# Patient Record
Sex: Female | Born: 1960 | Race: White | Hispanic: No | Smoking: Never smoker
Health system: Southern US, Community
[De-identification: ages and names within clinical notes are randomized; demographics above are authoritative.]

## PROBLEM LIST (undated history)

## (undated) DIAGNOSIS — T7840XA Allergy, unspecified, initial encounter: Secondary | ICD-10-CM

## (undated) HISTORY — DX: Allergy, unspecified, initial encounter: T78.40XA

---

## 2000-02-17 ENCOUNTER — Other Ambulatory Visit: Admission: RE | Admit: 2000-02-17 | Discharge: 2000-02-17 | Payer: Self-pay | Admitting: Obstetrics and Gynecology

## 2001-09-26 ENCOUNTER — Other Ambulatory Visit: Admission: RE | Admit: 2001-09-26 | Discharge: 2001-09-26 | Payer: Self-pay | Admitting: Obstetrics and Gynecology

## 2002-11-22 ENCOUNTER — Other Ambulatory Visit: Admission: RE | Admit: 2002-11-22 | Discharge: 2002-11-22 | Payer: Self-pay | Admitting: Obstetrics and Gynecology

## 2003-05-30 ENCOUNTER — Other Ambulatory Visit: Admission: RE | Admit: 2003-05-30 | Discharge: 2003-05-30 | Payer: Self-pay | Admitting: Obstetrics and Gynecology

## 2003-12-25 ENCOUNTER — Other Ambulatory Visit: Admission: RE | Admit: 2003-12-25 | Discharge: 2003-12-25 | Payer: Self-pay | Admitting: Obstetrics and Gynecology

## 2004-07-08 ENCOUNTER — Other Ambulatory Visit: Admission: RE | Admit: 2004-07-08 | Discharge: 2004-07-08 | Payer: Self-pay | Admitting: Obstetrics and Gynecology

## 2005-02-03 ENCOUNTER — Other Ambulatory Visit: Admission: RE | Admit: 2005-02-03 | Discharge: 2005-02-03 | Payer: Self-pay | Admitting: Obstetrics and Gynecology

## 2007-03-01 ENCOUNTER — Ambulatory Visit: Payer: Self-pay | Admitting: Family Medicine

## 2007-03-01 DIAGNOSIS — I479 Paroxysmal tachycardia, unspecified: Secondary | ICD-10-CM | POA: Insufficient documentation

## 2007-03-06 ENCOUNTER — Encounter: Payer: Self-pay | Admitting: Family Medicine

## 2007-03-07 ENCOUNTER — Telehealth: Payer: Self-pay | Admitting: Family Medicine

## 2007-03-07 ENCOUNTER — Encounter: Payer: Self-pay | Admitting: Family Medicine

## 2007-03-07 LAB — CONVERTED CEMR LAB
ALT: 13 units/L (ref 0–35)
AST: 14 units/L (ref 0–37)
Albumin: 4.3 g/dL (ref 3.5–5.2)
Alkaline Phosphatase: 69 units/L (ref 39–117)
BUN: 18 mg/dL (ref 6–23)
Chloride: 104 meq/L (ref 96–112)
Creatinine, Ser: 1.03 mg/dL (ref 0.40–1.20)
HDL: 69 mg/dL (ref >39)
Hemoglobin: 12.1 g/dL (ref 12.0–15.0)
LDL Cholesterol: 88 mg/dL (ref 0–99)
MCHC: 31 g/dL (ref 30.0–36.0)
Platelets: 256 10*3/uL (ref 150–400)
Potassium: 4.6 meq/L (ref 3.5–5.3)
RDW: 16.1 % — ABNORMAL HIGH (ref 11.5–14.0)
TSH: 3.26 microintl units/mL (ref 0.350–5.50)
Total CHOL/HDL Ratio: 2.8

## 2007-03-08 ENCOUNTER — Telehealth (INDEPENDENT_AMBULATORY_CARE_PROVIDER_SITE_OTHER): Payer: Self-pay | Admitting: *Deleted

## 2007-03-08 ENCOUNTER — Encounter: Payer: Self-pay | Admitting: Family Medicine

## 2007-03-08 LAB — CONVERTED CEMR LAB: Ferritin: 46 ng/mL (ref 10–291)

## 2007-03-12 ENCOUNTER — Encounter: Payer: Self-pay | Admitting: Family Medicine

## 2007-03-12 ENCOUNTER — Telehealth: Payer: Self-pay | Admitting: Family Medicine

## 2007-03-14 ENCOUNTER — Ambulatory Visit: Payer: Self-pay | Admitting: Cardiology

## 2007-04-04 ENCOUNTER — Encounter: Payer: Self-pay | Admitting: Family Medicine

## 2007-04-05 ENCOUNTER — Ambulatory Visit: Payer: Self-pay

## 2007-04-05 ENCOUNTER — Ambulatory Visit: Payer: Self-pay | Admitting: Cardiology

## 2007-04-05 ENCOUNTER — Encounter: Payer: Self-pay | Admitting: Cardiology

## 2007-05-09 ENCOUNTER — Ambulatory Visit: Payer: Self-pay | Admitting: Cardiology

## 2007-06-14 ENCOUNTER — Ambulatory Visit: Payer: Self-pay | Admitting: Family Medicine

## 2007-06-18 ENCOUNTER — Telehealth: Payer: Self-pay | Admitting: Family Medicine

## 2007-07-13 ENCOUNTER — Ambulatory Visit: Payer: Self-pay | Admitting: Family Medicine

## 2007-08-08 ENCOUNTER — Telehealth: Payer: Self-pay | Admitting: Family Medicine

## 2007-08-16 ENCOUNTER — Ambulatory Visit: Payer: Self-pay | Admitting: Family Medicine

## 2007-09-18 ENCOUNTER — Encounter: Admission: RE | Admit: 2007-09-18 | Discharge: 2007-09-18 | Payer: Self-pay | Admitting: Family Medicine

## 2007-09-18 ENCOUNTER — Ambulatory Visit: Payer: Self-pay | Admitting: Family Medicine

## 2007-09-18 LAB — CONVERTED CEMR LAB
Nitrite: NEGATIVE
Specific Gravity, Urine: 1.02
Urobilinogen, UA: 0.2

## 2007-10-29 LAB — HM DEXA SCAN: HM Dexa Scan: NORMAL

## 2007-11-08 ENCOUNTER — Telehealth: Payer: Self-pay | Admitting: Family Medicine

## 2008-02-05 ENCOUNTER — Telehealth: Payer: Self-pay | Admitting: Family Medicine

## 2008-02-15 ENCOUNTER — Ambulatory Visit: Payer: Self-pay | Admitting: Family Medicine

## 2008-02-15 DIAGNOSIS — G4700 Insomnia, unspecified: Secondary | ICD-10-CM | POA: Insufficient documentation

## 2008-03-19 ENCOUNTER — Telehealth: Payer: Self-pay | Admitting: Family Medicine

## 2008-04-30 ENCOUNTER — Telehealth: Payer: Self-pay | Admitting: Family Medicine

## 2008-07-01 ENCOUNTER — Telehealth: Payer: Self-pay | Admitting: Family Medicine

## 2008-09-18 ENCOUNTER — Telehealth: Payer: Self-pay | Admitting: Family Medicine

## 2008-10-02 ENCOUNTER — Ambulatory Visit: Payer: Self-pay | Admitting: Family Medicine

## 2008-10-09 ENCOUNTER — Telehealth: Payer: Self-pay | Admitting: Family Medicine

## 2008-10-23 ENCOUNTER — Ambulatory Visit: Payer: Self-pay | Admitting: Family Medicine

## 2008-10-23 ENCOUNTER — Telehealth: Payer: Self-pay | Admitting: Family Medicine

## 2008-10-23 ENCOUNTER — Encounter: Admission: RE | Admit: 2008-10-23 | Discharge: 2008-10-23 | Payer: Self-pay | Admitting: Family Medicine

## 2008-10-23 LAB — CONVERTED CEMR LAB
Nitrite: NEGATIVE
Protein, U semiquant: NEGATIVE
Urobilinogen, UA: 0.2
WBC Urine, dipstick: NEGATIVE

## 2008-10-24 LAB — CONVERTED CEMR LAB
BUN: 19 mg/dL (ref 6–23)
CO2: 22 meq/L (ref 19–32)
Cholesterol: 146 mg/dL (ref 0–200)
Creatinine, Ser: 1.03 mg/dL (ref 0.40–1.20)
Glucose, Bld: 88 mg/dL (ref 70–99)
HDL: 70 mg/dL (ref >39)
Total Bilirubin: 0.6 mg/dL (ref 0.3–1.2)
Total CHOL/HDL Ratio: 2.1
Total Protein: 7.4 g/dL (ref 6.0–8.3)
Triglycerides: 91 mg/dL (ref <150)
VLDL: 18 mg/dL (ref 0–40)

## 2008-10-27 ENCOUNTER — Telehealth: Payer: Self-pay | Admitting: Family Medicine

## 2008-11-03 ENCOUNTER — Telehealth: Payer: Self-pay | Admitting: Family Medicine

## 2009-01-07 ENCOUNTER — Telehealth: Payer: Self-pay | Admitting: Family Medicine

## 2009-02-06 ENCOUNTER — Ambulatory Visit: Payer: Self-pay | Admitting: Family Medicine

## 2009-02-06 DIAGNOSIS — T7491XA Unspecified adult maltreatment, confirmed, initial encounter: Secondary | ICD-10-CM | POA: Insufficient documentation

## 2009-06-03 ENCOUNTER — Telehealth: Payer: Self-pay | Admitting: Family Medicine

## 2009-06-19 ENCOUNTER — Encounter: Payer: Self-pay | Admitting: Family Medicine

## 2009-06-19 ENCOUNTER — Telehealth (INDEPENDENT_AMBULATORY_CARE_PROVIDER_SITE_OTHER): Payer: Self-pay | Admitting: *Deleted

## 2009-06-24 ENCOUNTER — Telehealth: Payer: Self-pay | Admitting: Family Medicine

## 2009-06-26 ENCOUNTER — Telehealth: Payer: Self-pay | Admitting: Family Medicine

## 2009-06-29 ENCOUNTER — Encounter: Payer: Self-pay | Admitting: Family Medicine

## 2011-01-18 NOTE — Assessment & Plan Note (Signed)
[facility]                            CARDIOLOGY OFFICE NOTE   NAME:Teresa Taylor, Teresa Taylor                        MRN:          [id number]  DATE:05/09/2007                            DOB:          [birth year]    The patient is a pleasant 50 year old female that I recently saw for  palpitations that appeared to be related to exercise.  We did reveal her  TSH and hemoglobin per Dr. Ovidio Kin office.  Her TSH was normal and she  was not severely anemic.  We scheduled her to have a stress  echocardiogram which was performed on April 05, 2007.  She exercised for  10 minutes.  Her heart rate increased to a maximum of 184.  There were  no EKG changes and there were no wall motion abnormalities.  She also  had a Holter monitor that showed sinus rhythm to sinus tachycardia with  occasional PAC's.  Since that time she has had no further symptoms.  She  denies any significant dyspnea other than when she is riding her bike up  hills.  There is no chest pain, palpitations, or syncope.  She wonders  if her previous episodes may have been related to stress.   MEDICATIONS:  Birth control pills and multivitamin.   PHYSICAL EXAMINATION:  VITAL SIGNS:  Blood pressure 116/73, pulse 66.  HEENT:  Normal.  NECK:  Supple.  CHEST:  Clear.  CARDIOVASCULAR:  Regular rate and rhythm.  EXTREMITIES:  No edema.   DIAGNOSIS:  Palpitations.  Her symptoms have resolved and her stress  echocardiogram, TSH, and Holter monitor are unremarkable.  She certainly  may have had an supraventricular tachycardia when she described her  heart rate at 210 previously.  However, this has resolved.  We have  elected to follow this.  If she has recurrent symptoms in the future,  then we could consider an event monitor plus/minus a beta blocker.  I  have scheduled her to come back on an as-needed basis and she will  contact us if her palpitations worsen.     Madolyn Frieze Jens Som, MD, [facility]  Electronically  Signed    BSC/MedQ  DD: 05/09/2007  DT: 05/09/2007  Job #: [id number]   cc:   Seymour Bars, D.O.

## 2011-01-18 NOTE — Assessment & Plan Note (Signed)
[facility]                            CARDIOLOGY OFFICE NOTE   NAME:Crites, Jaskiran                        MRN:          [id number]  DATE:03/14/2007                            DOB:          1961-01-27    Ms. Teresa Taylor is a 50 year old female with no prior cardiac history, who  we were asked to evaluate for an elevated heart rate and palpitations.  The patient typically has had some exercise intolerance in the past by  report, although she exercises routinely.  She bikes, takes spin  classes, and also does yoga.  Over the past few months she has noticed  increased dyspnea on exertion.  She notices this when she is climbing  stairs at home.  She also notices it when she exercises.  She otherwise  does not have dyspnea on exertion, orthopnea, PND, pedal edema,  presyncope, syncope, or exertional chest pain.  Also, recently she has  noticed an elevated heart rate when she exercises.  She states she was  riding a bicycle recently and noticed that her heart was racing and she  had mild dizziness.  She took her pulse 5 minutes later via machine at  [facility] and it read 210.  She thought the machine was broken but she did  have the pharmacist check it and he also stated it was in that range.  She has not had palpitations otherwise other than when she exercises.  Because of the above, we were asked to further evaluate.   Her medications at present include birth control pills.  She has no  known drug allergies.   SOCIAL HISTORY:  She does not smoke and only rarely consumes alcohol.  Her family history is negative for sudden death or coronary disease.   PAST MEDICAL HISTORY:  There is no diabetes mellitus, hypertension, or  hyperlipidemia.  She has had no previous surgeries.   She has one child.   On review of systems, she denies any headaches or fevers or chills.  There is no productive cough or hemoptysis.  There is no dysphagia,  odynophagia, melena or  hematochezia.  There is no dysuria or hematuria.  There is no rash or seizure activity.  There is no orthopnea, PND or  pedal edema.  She states that her menstrual cycles are not heavy.  She  does have a history of anemia.  The remaining systems are negative.   Her physical examination today shows a blood pressure of 123/82 and a  pulse of 78.  She weighs 125 pounds.  She is well-developed and well-nourished, in no acute distress.  Her skin is warm and dry.  She does not appear to be depressed, and  there is no peripheral clubbing.  Her back is normal.  Her HEENT is normal with normal eyelids.  Her neck is supple with a normal upstroke bilaterally and there are no  bruits noted.  There is no jugular venous distention and no  hepatomegaly.  Her chest is clear to auscultation with normal expansion.  Cardiovascular exam reveals a regular rate and rhythm, normal S1 and S2.  There are no murmurs, rubs or gallops noted.  There is no change with  Valsalva.  Her PMI is nondisplaced.  ABDOMINAL EXAM:  Not tender or distended, positive bowel sounds, no  hepatosplenomegaly, no mass appreciated.  There is no abdominal bruit.  She has 2+ femoral pulses bilaterally, no bruits.  Extremities show no edema, and I could palpate no cords.  She has 2+  posterior tibial pulses bilaterally.  Neurologic exam is grossly intact.   Her electrocardiogram shows a sinus rhythm at a rate of approximately  70.  The axis is normal.  There are no ST changes noted.   DIAGNOSES:  1. Palpitations:  The patient has had palpitations and this      predominantly occurs when she exercises.  We will schedule her to      have a stress echocardiogram both to quantify her left ventricular      function and to see what her heart rate does when she exercises.      We will also schedule her to have a Holter monitor to evaluate her      heart rate and rhythm when she is doing her routine activities.  If      we identify a rhythm  problem, then we will treat this as indicated.  2. Tachycardia:  As per #1.  We will check a stress echocardiogram as      well as Holter monitor.  I will also have her TSH and hemoglobin      forwarded to [place] from Dr. Ovidio Kin office.  She apparently has been      anemic in the past and this could be contributing to her symptoms.   We will see her back in approximately 4 weeks.     Madolyn Frieze Jens Som, MD, [facility]  Electronically Signed    BSC/MedQ  DD: 03/14/2007  DT: 03/15/2007  Job #: [id number]   cc:   Seymour Bars, D.O.

## 2011-03-27 ENCOUNTER — Encounter: Payer: Self-pay | Admitting: Family Medicine

## 2011-03-28 ENCOUNTER — Ambulatory Visit (INDEPENDENT_AMBULATORY_CARE_PROVIDER_SITE_OTHER): Payer: No Typology Code available for payment source | Admitting: Family Medicine

## 2011-03-28 ENCOUNTER — Encounter: Payer: Self-pay | Admitting: Family Medicine

## 2011-03-28 VITALS — BP 116/73 | HR 83 | Ht 64.0 in | Wt 140.0 lb

## 2011-03-28 DIAGNOSIS — R5383 Other fatigue: Secondary | ICD-10-CM

## 2011-03-28 DIAGNOSIS — R5381 Other malaise: Secondary | ICD-10-CM

## 2011-03-28 DIAGNOSIS — R635 Abnormal weight gain: Secondary | ICD-10-CM

## 2011-03-28 NOTE — Progress Notes (Signed)
  Subjective:    Patient ID: Teresa Taylor, female    DOB: 1960/10/23, 50 y.o.   MRN: [id number]  HPI  50 yo WF presents for f/u visit.  She has started to have irregular periods that were heavier than normal.  She is back on Loestrin to regulate her cycle.  She is very tired, poor focus and weight gain.  She is eating healthy, small portions and exercising regularly.  She has no energy in the morning despite sleeping well at night.    She was layed off the end of March.  She doesn't feel depressed.  She had better focus while she was on Vyvanse.  She is looking for work now.  She eats about 1000 kcal/ day - a mix of lean proteins and whole grain carbs.  She exercises for about an hour 5 days/ wk.  BP 116/73  Pulse 83  Ht 5\' 4"  (1.626 m)  Wt 140 lb (63.504 kg)  BMI 24.03 kg/m2  SpO2 98%  LMP 03/20/2011   Review of Systems  Constitutional: Positive for fatigue and unexpected weight change (gai). Negative for appetite change.  Neurological: Negative for light-headedness and headaches.  Psychiatric/Behavioral: Positive for decreased concentration. Negative for suicidal ideas, sleep disturbance and dysphoric mood. The patient is not nervous/anxious.        Objective:   Physical Exam  Constitutional: She appears well-developed and well-nourished.  Psychiatric: She has a normal mood and affect.          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  Iron/ B12 were normal per gyn.  She is going thru Perimenopausal.  After dietary review, she might be taking in too few calories and could stand to increase lean proteins and cut back on her carbs.  Weight training could also improve her basal metabolic rate.  Will have her see Hytham at [facility] for hormonal testing.,

## 2011-03-28 NOTE — Patient Instructions (Signed)
Call [facility] for salivary testing.  Hytham will fax me your results.  REad thru diet/ nutrition info on the [employer].

## 2011-03-29 ENCOUNTER — Telehealth: Payer: Self-pay | Admitting: Family Medicine

## 2011-03-29 NOTE — Telephone Encounter (Signed)
Pt calls and states she went to [facility] for saliva testing but needs to know what kind of saliva testing she needs.

## 2011-03-29 NOTE — Telephone Encounter (Signed)
It should be the one that tests for estrogen, progesterone, cortisol, testosterone and DHEA.

## 2011-03-29 NOTE — Telephone Encounter (Signed)
LMOM advising pt

## 2011-03-30 ENCOUNTER — Telehealth: Payer: Self-pay | Admitting: Family Medicine

## 2011-03-30 NOTE — Telephone Encounter (Signed)
Pt called and said she wants to know specifically what test of the 30 in the battery of test will be drawn.  She feels she will not need all of the 30 test because she said they charge per individual test and she said that could get expensive.  Does not want to repeat test that she has already done.  Please advise.  LMOM for the pt to call back and let us know what test she has already done. Jarvis Newcomer, LPN [facility]

## 2011-03-30 NOTE — Telephone Encounter (Signed)
Call [facility] and ask them to fax me a copy of the options.

## 2011-04-04 NOTE — Telephone Encounter (Signed)
Called [facility] on behalf of the pt as ordered also by Dr. Cathey Endow to obtain a copy of the battery of test so Dr. Cathey Endow can make a choice as to which test the pt needs. Jarvis Newcomer, LPN [facility]

## 2011-04-04 NOTE — Telephone Encounter (Signed)
Called [facility] and requested them to send the 30 battery list of test so Dr. Cathey Endow could picka nd choose the specific test she wants the pt to have.  [facility] said they would call the triage nurse back. Jarvis Newcomer, LPN [facility]

## 2011-04-05 ENCOUNTER — Ambulatory Visit: Payer: No Typology Code available for payment source | Admitting: Hematology & Oncology

## 2011-04-08 NOTE — Telephone Encounter (Signed)
When in clinic I saw a battery list come across and routed to Dr. Cathey Endow. Jarvis Newcomer, LPN [facility];

## 2011-04-12 ENCOUNTER — Telehealth: Payer: Self-pay | Admitting: Family Medicine

## 2011-04-12 NOTE — Telephone Encounter (Signed)
Pt called and would like to find out why the process of ordering her battery of test have taken so long.  Dr. Marcelle Overlie had already done all premenopausal test, and pt stated it is not her thyroid, and not menopausal pre or post.  Pt states she and Dr. Cathey Endow have obvoiusly a difference of opinion as to what direction to go with the testing.  Pt upset that Dr. Cathey Endow didn't tell her she was leaving.  This pt states she didn't receive a letter letting her know that Dr. Cathey Endow was leaving.   Plan:  Made a suggestion to the pt allowing her to make a choice, but ask if she would like to schedule an appt. With Dr. Linford Arnold, and as frustrating as it is will start the process over, and see if we can move forward with a positive note.  Pt also asked if we could wave the copay since she is having to start over with the process.  Told the pt that I'll have to get permission for that, and not sure if this is possible, but will ask.  Pt doesn't want to continue with Dr. Cathey Endow with this process and then have to change to a diff provider mid stream to finish the process.  Reviewed the telephone notes and saw where pt had been called and left a mess by Gaylyn Lambert letting her know that the battery of test to be done.  LMOM for the pt, but is stating today she never got the message.  Verified all the numbers listed in the system, and pt agreed they were right.  Pt stated the message must have went to someone else voice mail and not her's because she states she didn't get the message.  Will make management aware of this pts complaints and frustrations. Jarvis Newcomer, LPN [facility]

## 2011-04-13 NOTE — Telephone Encounter (Signed)
Notified office manager that I needed to talk with her about this pt. Pending. Jarvis Newcomer, LPN [facility]

## 2011-04-18 ENCOUNTER — Ambulatory Visit (INDEPENDENT_AMBULATORY_CARE_PROVIDER_SITE_OTHER): Payer: No Typology Code available for payment source | Admitting: Family Medicine

## 2011-04-18 ENCOUNTER — Telehealth: Payer: Self-pay | Admitting: Family Medicine

## 2011-04-18 ENCOUNTER — Encounter: Payer: Self-pay | Admitting: Family Medicine

## 2011-04-18 VITALS — BP 113/71 | HR 75 | Ht 64.0 in | Wt 140.0 lb

## 2011-04-18 DIAGNOSIS — R5383 Other fatigue: Secondary | ICD-10-CM

## 2011-04-18 DIAGNOSIS — R5381 Other malaise: Secondary | ICD-10-CM

## 2011-04-18 NOTE — Telephone Encounter (Signed)
I will let Toniann Fail decide what she wants to do about this. i agree that even if she had received the letter it probably woudn't have been before 03/28/11.

## 2011-04-18 NOTE — Telephone Encounter (Signed)
Patient had appointment today and states that she came in back in July and spoke with Dr. Cathey Endow about loss of mental focus and fatigue. She states that she never received the letter Dr. Cathey Endow was leaving the practice or she would of scheduled the appointment with Dr. Linford Arnold. Pt states that she spoke with Darl Pikes and she would speak with someone about waiving her co-pay for today's visit. But we did not mail out letters stating Dr. Cathey Endow was leaving our practice on March 28, 2011 so there is no way she would of waited to schedule her appointment she had with Dr. Cathey Endow on 03/28/11. Darl Pikes has made a note about this.Marland KitchenMarland Kitchen

## 2011-04-18 NOTE — Progress Notes (Signed)
  Subjective:    Patient ID: Teresa Taylor, female    DOB: [birth year], 50 y.o.   MRN: [id number]  HPI  Starting back in December starting feeling fatigued. Thought maybe premenopausal so went to see her gyn in Feb.  Was just a little anemic so placed on an iron tab. That didn't really help. Her fatigue got worse and then started gaining weight. He wanted her to f/u in 6 month.  Saw her gyn again in July.  Her focus is worse. Severe fatigue.  Has gained 15 lbs in 6 months. She is exercising and eatting well.  B12 was a little low. Thyroid was nl. Lost her job in March so actually started exercising more. EVen after a hicking trip she had gained 3 lbs.  Has noticed a vision change recently and plans to make an eye appointment.  Not diabetic, she had blood work to confirm this.. WEnt back on birth control at the beginning of May.  Feels lack of motivation. She says it's really an effort the morning to get out of bed and make her start her day. Usually things that she will look forward to she has been more apathetic about. She did go through a divorce last year.  I did review her meds with Dr. Cathey Endow.  Review of Systems     Objective:   Physical Exam  Constitutional: She appears well-developed and well-nourished.  HENT:  Head: Normocephalic and atraumatic.  Skin: Skin is warm and dry.  Psychiatric: She has a normal mood and affect. Her behavior is normal.          Assessment & Plan:  Extreme fatigue-unclear etiology at this point. Anemia has been ruled out. Diabetes has been ruled out. Abnormal thyroid has been ruled out. If she was perimenopausal then she is on birth control for the last several months without expected she would start to feel better if this were related more to her female hormones. At this point in time the only thing that I would recommend would be to check a cortisol and testosterone level.   Otherwise we discussed that mood is also possibly a cause of this. She also has a  prior history of ADD and was treated with 5 bands. She has been off of this for 6+ months. Certainly if she restarted this this could help with her weight control, her focus, and her energy level. I am almost wondering if her discontinuing the medication is part of the reason that she has noticed these changes. It does sound like she is doing adequate exercise and has made some changes to her diet. It is surprising that she has not lost some weight. We could also consider referral to endocrinology for further evaluation. We could also consider an SSRI to see if this would help as well with her lack of motivation.   30 minutes spent face-to-face with patient discussing her symptom,s possible causes, and possible treatments.

## 2011-04-19 ENCOUNTER — Encounter: Payer: Self-pay | Admitting: Family Medicine

## 2011-04-19 NOTE — Telephone Encounter (Signed)
LMOM for office manager.  Had tried to talk with her about this pt a couple of weeks ago, but Production designer, theatre/television/film got called away, and never came back. Jarvis Newcomer, LPN [facility]

## 2011-04-19 NOTE — Telephone Encounter (Signed)
Acknowledged this note upon return from vacation to the office this morning.  Had tried to get with office manager 2 weeks ago regarding this pt but manager got called away and was unable to complete conversation regarding this pt.  LMOM for the manager this morning to return triage nurse call regarding this patient to see if co-pay can be waived since pt is having to start the process over with her battery of test, and feels like the process would not be completed before Dr. Cathey Endow is leaving on 04-28-11.   Pending manager's call back to the triage nurse. Jarvis Newcomer, LPN [facility]'

## 2011-04-20 ENCOUNTER — Telehealth: Payer: Self-pay | Admitting: Family Medicine

## 2011-04-20 LAB — TESTOSTERONE: Testosterone: 33.25 ng/dL (ref 10–70)

## 2011-04-20 NOTE — Telephone Encounter (Signed)
Notified the manager Toniann Fail by phone regarding this pt as follow up to make sure message is conveyed on behalf of the pt as I told her I would.  Mgr. Will read notes and look into this situation. Jarvis Newcomer, LPN [facility]

## 2011-04-20 NOTE — Telephone Encounter (Signed)
Pt returned call to the triage nurse.  Gave pt her lab results.  Pt wants the provider asked if there is anything else she can take different than the vyvanse that affect the dopamine levels or even a homeopathic med.  If not, pt is willing to do the vyvanse.  She would like to avoid the controlled subst if it is possible.  Pt said she had already seen endocrinologist in the past and really does not want to go that route at this time.  Knows that the vyvanse will and has worked before, but first wants to see if there is anything else before going back on controlled subst.  Plan:  Routed to Dr. Linford Arnold for further evaluation.  Jarvis Newcomer, LPN [facility]

## 2011-04-20 NOTE — Telephone Encounter (Signed)
Pt called to get her lab results from yesterday.  Reviewed and saw recommendations given by the provider.   Plan:  Called the pt at [phone].  Got her voice mail.  LMOM and instructed the pt to call the triage nurse for the results. Jarvis Newcomer, LPN [facility]

## 2011-04-20 NOTE — Telephone Encounter (Signed)
Call patient: AM cortisol and testosterone levels are normal. At this point in time I would like for her to consider seeing an endocrinologist who is basically a hormone specialist to see if they can figure out why she is unable to lose weight. We could also consider restarting her Vyvanse since she has taken in the past and see if this helps with some of her symptoms of fatigue and lack of motivation as well.

## 2011-04-21 ENCOUNTER — Telehealth: Payer: Self-pay | Admitting: Family Medicine

## 2011-04-21 NOTE — Telephone Encounter (Signed)
Pt notifed that Dr. Linford Arnold is still reviewing her request and as soon as she has an answer she'll be called with recommendations.  Pt voiced understanding. Jarvis Newcomer, LPN [facility]

## 2011-04-21 NOTE — Telephone Encounter (Signed)
Dr. Linford Arnold, The big issue pt was concerned with as far as the vyvanse was insurance reasons, and she has talked to the insurance company and it is not going to be a problem and wanted the provider to know that.  Pt informed Dr. Linford Arnold in the process of making decision, but has not made a determination as of yet. Routed to Dr. Marlyne Beards, LPN [facility]

## 2011-04-21 NOTE — Telephone Encounter (Signed)
Patient called left a voicemail that she is waiting to hear back from you about being able to take anything different than vyvanse.She request to know if you can call her back

## 2011-04-22 NOTE — Telephone Encounter (Signed)
I really don't have any homepathic remedies. Cn restart her vyvanse and see her back in 8 weeks.  Darl Pikes can you find out what dose taking adn enter rx and I will sign? Thank you.

## 2011-04-22 NOTE — Telephone Encounter (Signed)
Pt notified that Dr. Linford Arnold does not have any homeopathic remedies.  She wants her to start the vyvanse prescription, and come back in 8 weeks for followup.  Pt originally on vyvanse 40 mg PO daily, and pt has asked if provider can give slightly lower dose that would be good.  Pt said she could tell a remarkable diff last night but just a little trouble getting to sleep, and that is why she is asking for a slightly lower dose.  Told the pt she could pup the prescription Monday 04-25-11 in our office. Routed to Dr. Linford Arnold for review. Jarvis Newcomer, LPN [facility]

## 2011-04-25 MED ORDER — LISDEXAMFETAMINE DIMESYLATE 30 MG PO CAPS: 30.0000 mg | ORAL_CAPSULE | ORAL | Status: DC

## 2011-04-25 NOTE — Telephone Encounter (Signed)
Rx waiting up front. Ok to pick up today.

## 2011-04-26 ENCOUNTER — Telehealth: Payer: Self-pay | Admitting: Family Medicine

## 2011-04-26 NOTE — Telephone Encounter (Signed)
Pt informed of savings for the vyvanse via the internet.  She has computer access from home and also the 800 # given for the pt to call to see in detail all the savings programs available for this medication.   Plan:  Pt will access the 1 800 # to see.  She has already accessed what the internet would allow her to print but she has not met her deductible yet, and that is why her deductible is higher at current, but pt will call the 1-800# to see if there is anything else. Jarvis Newcomer, LPN [facility]

## 2011-04-26 NOTE — Telephone Encounter (Signed)
Pt called and left voice mail mess for the triage nurse.  Stated she had been prescribed vyvanse, and took the script to her pharm and the copay was going to be $160.00.  Pt called asking for a coupon card for this medication, and I looked in the sample room, but do not see a coupon card.  Please advise if we haveever had coupon card for this medication and maybe if I am overlooking?? Plan:  Routed to the provider Jarvis Newcomer, LPN [facility]

## 2011-04-26 NOTE — Telephone Encounter (Signed)
Lets see if online coupon.  If not we can call the rep and see if they can drop some off.

## 2011-05-16 ENCOUNTER — Telehealth: Payer: Self-pay | Admitting: *Deleted

## 2011-05-16 NOTE — Telephone Encounter (Signed)
Pt calls and states you prescribed her the Vyvanse 30mg  and this is not working that well and wanted to know if you would give her the Vyvanse 40mg - states has taken that before and felt immediately she could focus better. Did say that she is only half way through the 30mg  dose and didn't know if could get the 40mg . Please advise  Her insurance will deny the early refill because it is a scheduled drug. But certainly when she is done with this prescription in 2 weeks we can increase to 40 mg pill. I can go ahead and print a new prescription that she will not be able to fill it yet. Pt notified of above info.

## 2011-05-23 ENCOUNTER — Other Ambulatory Visit: Payer: Self-pay | Admitting: Family Medicine

## 2011-05-23 MED ORDER — LISDEXAMFETAMINE DIMESYLATE 40 MG PO CAPS: 40.0000 mg | ORAL_CAPSULE | ORAL | Status: DC

## 2011-05-23 NOTE — Telephone Encounter (Signed)
Will inc to 40.  F/U in 1 month with me to see if helping.  Rx printed.

## 2011-05-23 NOTE — Telephone Encounter (Signed)
Pt notified that script ready for pup. Teresa Newcomer, LPN [facility]

## 2011-05-23 NOTE — Telephone Encounter (Signed)
Pt called for refill of her vyvanse, and she has previously been on 30 mg, but she stated that she and the provider had discussed increasing her to 40 mg with the next refill.  Please advise.  Looked back at the office notes, but didn't see anything regarding increasing the medication. Plan:  Routed to the provider for review and auth. Jarvis Newcomer, LPN [facility]

## 2011-06-29 ENCOUNTER — Other Ambulatory Visit: Payer: Self-pay | Admitting: *Deleted

## 2011-06-29 MED ORDER — LISDEXAMFETAMINE DIMESYLATE 40 MG PO CAPS: 40.0000 mg | ORAL_CAPSULE | ORAL | Status: DC

## 2011-07-07 ENCOUNTER — Encounter: Payer: Self-pay | Admitting: Family Medicine

## 2011-07-07 ENCOUNTER — Ambulatory Visit (INDEPENDENT_AMBULATORY_CARE_PROVIDER_SITE_OTHER): Payer: No Typology Code available for payment source | Admitting: Family Medicine

## 2011-07-07 DIAGNOSIS — F988 Other specified behavioral and emotional disorders with onset usually occurring in childhood and adolescence: Secondary | ICD-10-CM

## 2011-07-07 NOTE — Progress Notes (Signed)
  Subjective:    Patient ID: Teresa Taylor, female    DOB: 1961/04/30, 50 y.o.   MRN: [id number]  HPI ADD - says she restarted the 40mg  and says she felt good and had more energy, felt more focused.  Weight dropped. She dropped 8 lbs. Says feels like last month she got dud pills and they didn't work but the refill she got about 3 days ago is working well. Says the 30mg  helped some but not as much.  But he 40mg  works better but causes sleep didn't problems.  No CP or SOB. No skipping meals.    Review of Systems     Objective:   Physical Exam  Constitutional: She is oriented to person, place, and time. She appears well-developed and well-nourished.  HENT:  Head: Normocephalic and atraumatic.  Cardiovascular: Normal rate, regular rhythm and normal heart sounds.   Pulmonary/Chest: Effort normal and breath sounds normal.  Neurological: She is alert and oriented to person, place, and time.  Skin: Skin is warm and dry.  Psychiatric: She has a normal mood and affect. Her behavior is normal.          Assessment & Plan:  ADD- Will continue with the vyvanse 40mg  and doing well. Try taking it about 1 hours earllier and see if the sleep is better. If not then consider chaning to 20mg  and write is as 1-2 tabs by mouth daily so can alternate dose to help her sleep.

## 2011-08-05 ENCOUNTER — Telehealth: Payer: Self-pay | Admitting: *Deleted

## 2011-08-05 MED ORDER — LISDEXAMFETAMINE DIMESYLATE 20 MG PO CAPS: ORAL_CAPSULE | ORAL | Status: DC

## 2011-08-05 NOTE — Telephone Encounter (Signed)
Pt calls and needs a refill on the Vyvanse. Pt is on 40mg . But said you would write when needed again for 30mg  twice a day for 30 days. Cheaper for the patient

## 2011-08-05 NOTE — Telephone Encounter (Signed)
OK to pick up rx.

## 2011-08-10 ENCOUNTER — Telehealth: Payer: Self-pay | Admitting: *Deleted

## 2011-08-10 MED ORDER — LISDEXAMFETAMINE DIMESYLATE 40 MG PO CAPS: 40.0000 mg | ORAL_CAPSULE | ORAL | Status: DC

## 2011-08-10 NOTE — Telephone Encounter (Signed)
Pt LMOM stating pharm would not fill fill vyvanse bc it was written for 1-2 tabs po qd #60. Pt needs new Rx.

## 2011-09-09 ENCOUNTER — Ambulatory Visit: Payer: No Typology Code available for payment source | Admitting: Physician Assistant

## 2011-09-09 DIAGNOSIS — Z0289 Encounter for other administrative examinations: Secondary | ICD-10-CM

## 2011-09-13 ENCOUNTER — Encounter: Payer: Self-pay | Admitting: Physician Assistant

## 2011-09-13 ENCOUNTER — Ambulatory Visit
Admission: RE | Admit: 2011-09-13 | Discharge: 2011-09-13 | Disposition: A | Discharge status: Home / Self Care | Payer: No Typology Code available for payment source | Source: Ambulatory Visit | Attending: Physician Assistant | Admitting: Physician Assistant

## 2011-09-13 ENCOUNTER — Ambulatory Visit (INDEPENDENT_AMBULATORY_CARE_PROVIDER_SITE_OTHER): Payer: Self-pay | Admitting: Physician Assistant

## 2011-09-13 ENCOUNTER — Ambulatory Visit: Payer: Self-pay | Admitting: Physician Assistant

## 2011-09-13 VITALS — BP 111/72 | HR 81 | Wt 135.0 lb

## 2011-09-13 DIAGNOSIS — M545 Low back pain, unspecified: Secondary | ICD-10-CM

## 2011-09-13 NOTE — Patient Instructions (Signed)
Continue Ibuprofen 200mg  three times a day. Start icing for 15-20 min. Continue yoga and stretches.   Back Pain, Adult Low back pain is very common. About 1 in 5 people have back pain.The cause of low back pain is rarely dangerous. The pain often gets better over time.About half of people with a sudden onset of back pain feel better in just 2 weeks. About 8 in 10 people feel better by 6 weeks.  CAUSES Some common causes of back pain include:  Strain of the muscles or ligaments supporting the spine.   Wear and tear (degeneration) of the spinal discs.   Arthritis.   Direct injury to the back.  DIAGNOSIS Most of the time, the direct cause of low back pain is not known.However, back pain can be treated effectively even when the exact cause of the pain is unknown.Answering your caregiver's questions about your overall health and symptoms is one of the most accurate ways to make sure the cause of your pain is not dangerous. If your caregiver needs more information, he or she may order lab work or imaging tests (X-rays or MRIs).However, even if imaging tests show changes in your back, this usually does not require surgery. HOME CARE INSTRUCTIONS For many people, back pain returns.Since low back pain is rarely dangerous, it is often a condition that people can learn to [facility] their own.   Remain active. It is stressful on the back to sit or stand in one place. Do not sit, drive, or stand in one place for more than 30 minutes at a time. Take short walks on level surfaces as soon as pain allows.Try to increase the length of time you walk each day.   Do not stay in bed.Resting more than 1 or 2 days can delay your recovery.   Do not avoid exercise or work.Your body is made to move.It is not dangerous to be active, even though your back may hurt.Your back will likely heal faster if you return to being active before your pain is gone.   Pay attention to your body when you bend and lift.  Many people have less discomfortwhen lifting if they bend their knees, keep the load close to their bodies,and avoid twisting. Often, the most comfortable positions are those that put less stress on your recovering back.   Find a comfortable position to sleep. Use a firm mattress and lie on your side with your knees slightly bent. If you lie on your back, put a pillow under your knees.   Only take over-the-counter or prescription medicines as directed by your caregiver. Over-the-counter medicines to reduce pain and inflammation are often the most helpful.Your caregiver may prescribe muscle relaxant drugs.These medicines help dull your pain so you can more quickly return to your normal activities and healthy exercise.   Put ice on the injured area.   Put ice in a plastic bag.   Place a towel between your skin and the bag.   Leave the ice on for 15 to 20 minutes, 3 to 4 times a day for the first 2 to 3 days. After that, ice and heat may be alternated to reduce pain and spasms.   Ask your caregiver about trying back exercises and gentle massage. This may be of some benefit.   Avoid feeling anxious or stressed.Stress increases muscle tension and can worsen back pain.It is important to recognize when you are anxious or stressed and learn ways to manage it.Exercise is a great option.  SEEK MEDICAL  CARE IF:  You have pain that is not relieved with rest or medicine.   You have pain that does not improve in 1 week.   You have new symptoms.   You are generally not feeling well.  SEEK IMMEDIATE MEDICAL CARE IF:   You have pain that radiates from your back into your legs.   You develop new bowel or bladder control problems.   You have unusual weakness or numbness in your arms or legs.   You develop nausea or vomiting.   You develop abdominal pain.   You feel faint.  Document Released: 08/22/2005 Document Revised: 05/04/2011 Document Reviewed: 01/10/2011 [facility] Patient  Information 2012 [place], [place].
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
## 2011-09-13 NOTE — Progress Notes (Signed)
  Subjective:    Patient ID: Teresa Taylor, female    DOB: Sep 24, 1960, 51 y.o.   MRN: [id number]  HPI    Review of Systems     Objective:   Physical Exam        Assessment & Plan:  Pt informed of xr results and further instruction given.

## 2011-09-28 ENCOUNTER — Telehealth: Payer: Self-pay | Admitting: Family Medicine

## 2011-09-28 MED ORDER — LISDEXAMFETAMINE DIMESYLATE 40 MG PO CAPS: 40.0000 mg | ORAL_CAPSULE | ORAL | Status: DC

## 2011-09-28 NOTE — Telephone Encounter (Signed)
Patient called request to have a refill of vivance and a call once the script is ready her number is [phone]

## 2011-11-16 ENCOUNTER — Other Ambulatory Visit: Payer: Self-pay | Admitting: Obstetrics and Gynecology

## 2011-11-16 DIAGNOSIS — R928 Other abnormal and inconclusive findings on diagnostic imaging of breast: Secondary | ICD-10-CM

## 2011-11-23 ENCOUNTER — Ambulatory Visit
Admission: RE | Admit: 2011-11-23 | Discharge: 2011-11-23 | Disposition: A | Discharge status: Home / Self Care | Payer: No Typology Code available for payment source | Source: Ambulatory Visit | Attending: Obstetrics and Gynecology | Admitting: Obstetrics and Gynecology

## 2011-11-23 DIAGNOSIS — R928 Other abnormal and inconclusive findings on diagnostic imaging of breast: Secondary | ICD-10-CM

## 2011-12-08 ENCOUNTER — Ambulatory Visit (INDEPENDENT_AMBULATORY_CARE_PROVIDER_SITE_OTHER): Payer: No Typology Code available for payment source | Admitting: Family Medicine

## 2011-12-08 DIAGNOSIS — Z23 Encounter for immunization: Secondary | ICD-10-CM

## 2011-12-08 DIAGNOSIS — T7491XA Unspecified adult maltreatment, confirmed, initial encounter: Secondary | ICD-10-CM

## 2011-12-08 MED ORDER — LISDEXAMFETAMINE DIMESYLATE 40 MG PO CAPS: 40.0000 mg | ORAL_CAPSULE | ORAL | Status: DC

## 2011-12-08 NOTE — Progress Notes (Signed)
  Subjective:    Patient ID: Teresa Taylor, female    DOB: Mar 02, 1961, 51 y.o.   MRN: [id number] Here for Hep A and Tdap injection [place] HPI    Review of Systems     Objective:   Physical Exam        Assessment & Plan:

## 2012-01-17 ENCOUNTER — Other Ambulatory Visit: Payer: Self-pay | Admitting: *Deleted

## 2012-01-17 MED ORDER — LISDEXAMFETAMINE DIMESYLATE 40 MG PO CAPS: 40.0000 mg | ORAL_CAPSULE | ORAL | Status: DC

## 2012-02-28 ENCOUNTER — Other Ambulatory Visit: Payer: Self-pay | Admitting: *Deleted

## 2012-02-28 MED ORDER — LISDEXAMFETAMINE DIMESYLATE 40 MG PO CAPS: 40.0000 mg | ORAL_CAPSULE | ORAL | Status: DC

## 2012-04-18 ENCOUNTER — Other Ambulatory Visit: Payer: Self-pay | Admitting: *Deleted

## 2012-04-18 MED ORDER — LISDEXAMFETAMINE DIMESYLATE 40 MG PO CAPS: 40.0000 mg | ORAL_CAPSULE | ORAL | Status: DC

## 2012-05-23 ENCOUNTER — Encounter: Payer: Self-pay | Admitting: Physician Assistant

## 2012-05-23 ENCOUNTER — Ambulatory Visit (INDEPENDENT_AMBULATORY_CARE_PROVIDER_SITE_OTHER): Payer: Self-pay | Admitting: Physician Assistant

## 2012-05-23 VITALS — BP 110/66 | HR 86 | Ht 64.0 in | Wt 136.0 lb

## 2012-05-23 DIAGNOSIS — M79671 Pain in right foot: Secondary | ICD-10-CM

## 2012-05-23 DIAGNOSIS — M79609 Pain in unspecified limb: Secondary | ICD-10-CM

## 2012-05-23 MED ORDER — LISDEXAMFETAMINE DIMESYLATE 40 MG PO CAPS: 40.0000 mg | ORAL_CAPSULE | ORAL | Status: DC

## 2012-05-23 NOTE — Progress Notes (Signed)
  Subjective:    Patient ID: Teresa Taylor, female    DOB: 01/22/1961, 51 y.o.   MRN: [id number]  HPI Patient presents to the clinic with an injury to her right foot that occurred on Thursday of last week about 7 days ago. Patient slipped and fell while she was wearing heels and landed on the inside of her foot at the metatarsal joint. She does have bilateral bunions and a bunion on the right seems more irritated currently than the left. There is a lot of pain while walking flat-footed and she has been compensating by walking on the external plantar surface. Yesterday she noticed her back started to take and she was concerned that her compensation of walking is starting to affect her back. She has been wearing flats since the incident 7 days ago. She did have minimal swelling after injury; however, the swelling has decreased significantly and she is now left with pain. She has never had any other injury to this right foot. She has used ibuprofen off and on over the past 7 days and it does help.    Review of Systems     Objective:   Physical Exam  Constitutional: She is oriented to person, place, and time. She appears well-developed and well-nourished.  HENT:  Head: Normocephalic.  Musculoskeletal:       Normal ROM of right ankle and foot. Strength 5/5. Ankle reflex is 2+. Bunion appears to be slightly swollen and irritated. Pain with palpation at the plantar aspect of great toe at the MTP joint. No other bony pain with palpation.  Neurological: She is alert and oriented to person, place, and time.  Psychiatric: She has a normal mood and affect. Her behavior is normal.          Assessment & Plan:  Right foot injury/pain- I sent pt for x-ray of right foot. I put her in a post-op boot to relieve pressure from walking so she would not have to compensate by walking on the external plantar foot. She was told to take ibuprofen 800mg  TID and ice at night. Pending x-ray results but I suspect she  will need to be in boot 2 weeks and then come out as tolerated. Follow up in 2 weeks if not improving.

## 2012-05-23 NOTE — Patient Instructions (Addendum)
Ibuprofen 800mg  TID. Ice .Next week rest. Call if not improving in next 2 weeks.

## 2012-07-06 ENCOUNTER — Other Ambulatory Visit: Payer: Self-pay | Admitting: *Deleted

## 2012-07-06 MED ORDER — LISDEXAMFETAMINE DIMESYLATE 40 MG PO CAPS: 40.0000 mg | ORAL_CAPSULE | ORAL | Status: DC

## 2012-07-06 NOTE — Telephone Encounter (Signed)
Pt calls and request a refill on her Vyvanse. Are you ok with this- last eval in office was 2012

## 2012-07-06 NOTE — Telephone Encounter (Signed)
Ok to fill 1 more month but needs to make f/u ADD appt.

## 2012-09-03 ENCOUNTER — Other Ambulatory Visit: Payer: Self-pay | Admitting: *Deleted

## 2012-09-03 MED ORDER — LISDEXAMFETAMINE DIMESYLATE 40 MG PO CAPS: 40.0000 mg | ORAL_CAPSULE | ORAL | Status: DC

## 2012-10-17 ENCOUNTER — Telehealth: Payer: Self-pay | Admitting: *Deleted

## 2012-10-17 MED ORDER — LISDEXAMFETAMINE DIMESYLATE 40 MG PO CAPS: 40.0000 mg | ORAL_CAPSULE | ORAL | Status: DC

## 2012-10-17 NOTE — Telephone Encounter (Signed)
Pt calls and request a refill on Vyvanse. Last appt 05/2012. Please advise

## 2012-10-17 NOTE — Telephone Encounter (Signed)
Okay to go for one more month but she really does need to make an appointment to followup on her ADD. We haven't actually documented a discussion about her ADD in over a year.

## 2013-01-04 ENCOUNTER — Telehealth: Payer: Self-pay | Admitting: *Deleted

## 2013-01-04 NOTE — Telephone Encounter (Signed)
Pt calls today asking for refill on her vyvanse.  I see in a past note that you wanted her to come in.  Would you like me to deny her request and have her make an appt first? Please advise

## 2013-01-04 NOTE — Telephone Encounter (Signed)
Spoke with pt today regarding a denied request to fill her vyvanse.  Pt was informed that she hadn't been seen for ADD since 2012 and needed an appt and that was the reason for the denial.  Pt became very angry & stated that she had never been told that she would have to come in every so often to be reevaluated.  I explained to her the policy for controlled substances.  She understood but was still angry that she had never been advised.  She also stated that she will probably be shopping for another pcp. Just thought you should know.

## 2013-01-04 NOTE — Telephone Encounter (Signed)
OK to deny

## 2013-01-04 NOTE — Telephone Encounter (Signed)
Left detailed message on vm to schedule an appt.

## 2013-01-09 ENCOUNTER — Ambulatory Visit (INDEPENDENT_AMBULATORY_CARE_PROVIDER_SITE_OTHER): Payer: BC Managed Care – PPO | Admitting: Physician Assistant

## 2013-01-09 ENCOUNTER — Encounter: Payer: Self-pay | Admitting: Physician Assistant

## 2013-01-09 VITALS — BP 118/68 | HR 87 | Wt 136.0 lb

## 2013-01-09 DIAGNOSIS — F909 Attention-deficit hyperactivity disorder, unspecified type: Secondary | ICD-10-CM | POA: Insufficient documentation

## 2013-01-09 LAB — COMPREHENSIVE METABOLIC PANEL
ALT: 14 U/L (ref 0–35)
CO2: 25 mEq/L (ref 19–32)
Calcium: 8.8 mg/dL (ref 8.4–10.5)
Chloride: 109 mEq/L (ref 96–112)
Creat: 0.95 mg/dL (ref 0.50–1.10)
Total Protein: 6.7 g/dL (ref 6.0–8.3)

## 2013-01-09 MED ORDER — LISDEXAMFETAMINE DIMESYLATE 40 MG PO CAPS: 40.0000 mg | ORAL_CAPSULE | ORAL | Status: DC

## 2013-01-09 NOTE — Progress Notes (Signed)
  Subjective:    Patient ID: Teresa Taylor, female    DOB: Aug 22, 1961, 52 y.o.   MRN: [id number]  HPI Patient presents to the clinic to follow up on ADHD and get med refill.   ADHD- controlled while on vyvanse. Tried to go off medication and she was having problems at work and at home focusing. She knows she needs to be on meds. Denies any CP, palpitations, SOB, anorexia, or insomnia.She has been on it since 2012.   Review of Systems     Objective:   Physical Exam  Constitutional: She is oriented to person, place, and time. She appears well-developed and well-nourished.  HENT:  Head: Normocephalic and atraumatic.  Cardiovascular: Normal rate, regular rhythm and normal heart sounds.   Pulmonary/Chest: Effort normal and breath sounds normal.  Neurological: She is alert and oriented to person, place, and time.  Skin: Skin is warm and dry.  Psychiatric: She has a normal mood and affect. Her behavior is normal.          Assessment & Plan:  ADHD- Refilled Vyvanse for 3 months. Follow up 3 months. Sent lab to check liver enzymes.   Requested pt to send labs from OBGYN to our office since she reports they have done screening labs on her.

## 2013-03-29 ENCOUNTER — Ambulatory Visit (INDEPENDENT_AMBULATORY_CARE_PROVIDER_SITE_OTHER): Payer: BC Managed Care – PPO | Admitting: Physician Assistant

## 2013-03-29 ENCOUNTER — Encounter: Payer: Self-pay | Admitting: Physician Assistant

## 2013-03-29 VITALS — BP 108/71 | HR 80 | Wt 135.0 lb

## 2013-03-29 DIAGNOSIS — H6690 Otitis media, unspecified, unspecified ear: Secondary | ICD-10-CM

## 2013-03-29 DIAGNOSIS — F909 Attention-deficit hyperactivity disorder, unspecified type: Secondary | ICD-10-CM

## 2013-03-29 DIAGNOSIS — H6691 Otitis media, unspecified, right ear: Secondary | ICD-10-CM

## 2013-03-29 MED ORDER — LISDEXAMFETAMINE DIMESYLATE 40 MG PO CAPS: 40.0000 mg | ORAL_CAPSULE | ORAL | Status: DC

## 2013-03-29 MED ORDER — AMOXICILLIN-POT CLAVULANATE 875-125 MG PO TABS: 1.0000 | ORAL_TABLET | Freq: Two times a day (BID) | ORAL | Status: DC

## 2013-03-29 NOTE — Patient Instructions (Addendum)

## 2013-03-29 NOTE — Progress Notes (Signed)
  Subjective:    Patient ID: Teresa Taylor, female    DOB: May 22, 1961, 52 y.o.   MRN: [id number]  HPI Patient presents to the clinic with right ear pain for last 2 days. She has had sharp pains and ear is tender to touch. Denies any fever, chills, SOB, cough, ST. She does feel like lymphnode behind ear is enlarged. She is taking a decongestant and anti-histamine along with ibuprofen and not really helping. No sick contacts.   She is also doing well and needs refill of ADHD meds. She denies any anorexia, insomnia out of the ordinary, palpitations. She finds she only needs it on days that she works and that is how she takes it. She is much more focused and able to complete tasks.    Review of Systems     Objective:   Physical Exam  Constitutional: She is oriented to person, place, and time. She appears well-developed and well-nourished.  HENT:  Head: Normocephalic and atraumatic.  Left Ear: External ear normal.  Nose: Nose normal.  Mouth/Throat: Oropharynx is clear and moist.  TM dull and ossicles not visulized. No blood or pus. Erythema around TM and in external canal.   Negative for maxillary or frontal tenderness.   Eyes: Conjunctivae are normal.  Neck: Normal range of motion. Neck supple.  Right sided anterior cervical lymphnodes swollen and tender.   Cardiovascular: Normal rate, regular rhythm and normal heart sounds.   Pulmonary/Chest: Effort normal and breath sounds normal. She has no wheezes.  Neurological: She is alert and oriented to person, place, and time.  Skin: Skin is warm and dry.  Psychiatric: She has a normal mood and affect. Her behavior is normal.          Assessment & Plan:  Otitis media, right ear- Treated with Augmentin for 10 days. Gave HO on symptomatic care. Encouraged to continue decongestant for next couple of days.   ADHD- Pt doing well. Reassured pt to take weekend and holidays off if she feels like she does not need. Refilled for 6 months then  needs OV.

## 2013-11-27 ENCOUNTER — Ambulatory Visit (INDEPENDENT_AMBULATORY_CARE_PROVIDER_SITE_OTHER): Payer: BC Managed Care – PPO | Admitting: Physician Assistant

## 2013-11-27 ENCOUNTER — Encounter: Payer: Self-pay | Admitting: Physician Assistant

## 2013-11-27 VITALS — BP 132/78 | HR 84 | Ht 64.0 in | Wt 140.0 lb

## 2013-11-27 DIAGNOSIS — F909 Attention-deficit hyperactivity disorder, unspecified type: Secondary | ICD-10-CM

## 2013-11-27 DIAGNOSIS — S0993XA Unspecified injury of face, initial encounter: Secondary | ICD-10-CM

## 2013-11-27 DIAGNOSIS — S0992XA Unspecified injury of nose, initial encounter: Secondary | ICD-10-CM

## 2013-11-27 DIAGNOSIS — S199XXA Unspecified injury of neck, initial encounter: Secondary | ICD-10-CM

## 2013-11-27 MED ORDER — LISDEXAMFETAMINE DIMESYLATE 40 MG PO CAPS: 40.0000 mg | ORAL_CAPSULE | ORAL | Status: DC

## 2013-11-27 MED ORDER — PREDNISONE 50 MG PO TABS: ORAL_TABLET | ORAL | Status: DC

## 2013-11-27 NOTE — Progress Notes (Signed)
   Subjective:    Patient ID: Teresa Taylor, female    DOB: 08/07/1961, 53 y.o.   MRN: [id number]  HPI Patient is a 53 year old female who presents to the clinic to followup after trauma to the nasal bridge 10 days ago. She was doing a yoga crow pose and fell straight on her nose. Her nose bled initially. She did not seek medical attention. She did ice it and take ibuprofen. The pain is significantly better she's having a lot of problems breathing out of her right side of her nose. She has noticed that her throat is now starting to her. She's concerned she might need further attention.   Review of Systems     Objective:   Physical Exam  Constitutional: She is oriented to person, place, and time. She appears well-developed and well-nourished.  HENT:  Head: Normocephalic and atraumatic.    Right nare turbinates completely swollen shut, left nare patent.  No pain with palpation over nasal bridge.   Cardiovascular: Normal rate, regular rhythm and normal heart sounds.   Pulmonary/Chest: Effort normal and breath sounds normal.  Neurological: She is alert and oriented to person, place, and time.  Psychiatric: She has a normal mood and affect. Her behavior is normal.          Assessment & Plan:  Fall/trauma to nose/right near swollen- patient nasal bone seems intact. There is no significant bruising or swelling externally. Right nose was not patent. Too many days have passed for imaging. Will treat with prednisone for 5 days. Hopefully this will help with some of the nasal swelling internally. At this point I do not want to treat with nasal steroids however that could provide some extra benefit. I do think the sore throats calls from being more of her mouth breather since nares are congested. If not improving may need to consider referral to ear nose and throat.  ADHD-on the way out the door patient asked for a refill we did not discuss in office visit at all about ADHD. I did refill for  short supply and needs to followup.

## 2014-01-08 ENCOUNTER — Other Ambulatory Visit: Payer: Self-pay | Admitting: Obstetrics and Gynecology

## 2014-01-08 DIAGNOSIS — N6009 Solitary cyst of unspecified breast: Secondary | ICD-10-CM

## 2014-01-13 ENCOUNTER — Encounter (INDEPENDENT_AMBULATORY_CARE_PROVIDER_SITE_OTHER): Payer: BC Managed Care – PPO | Admitting: Physician Assistant

## 2014-01-13 ENCOUNTER — Encounter: Payer: Self-pay | Admitting: Physician Assistant

## 2014-01-13 ENCOUNTER — Ambulatory Visit (INDEPENDENT_AMBULATORY_CARE_PROVIDER_SITE_OTHER): Payer: BC Managed Care – PPO | Admitting: Physician Assistant

## 2014-01-13 VITALS — BP 111/71 | HR 87 | Ht 64.0 in | Wt 141.0 lb

## 2014-01-13 DIAGNOSIS — J3489 Other specified disorders of nose and nasal sinuses: Secondary | ICD-10-CM

## 2014-01-13 DIAGNOSIS — R0981 Nasal congestion: Secondary | ICD-10-CM

## 2014-01-13 DIAGNOSIS — S0993XA Unspecified injury of face, initial encounter: Secondary | ICD-10-CM

## 2014-01-13 DIAGNOSIS — S199XXA Unspecified injury of neck, initial encounter: Secondary | ICD-10-CM

## 2014-01-13 DIAGNOSIS — S0992XA Unspecified injury of nose, initial encounter: Secondary | ICD-10-CM

## 2014-01-13 DIAGNOSIS — F909 Attention-deficit hyperactivity disorder, unspecified type: Secondary | ICD-10-CM

## 2014-01-13 MED ORDER — LISDEXAMFETAMINE DIMESYLATE 50 MG PO CAPS: 50.0000 mg | ORAL_CAPSULE | Freq: Every day | ORAL | Status: DC

## 2014-01-14 NOTE — Progress Notes (Signed)
This encounter was created in error - please disregard.

## 2014-01-15 NOTE — Progress Notes (Signed)
   Subjective:    Patient ID: Teresa Taylor, female    DOB: 06/25/1961, 53 y.o.   MRN: [id number]  HPI Pt is a 53 yo female who presents to the clinic to follow up on ADHD medication. Pt is taking vyvanse. She is doing fine without any side effects. She has noticed it does not seem to be controlling focus as much as she would like. She had to take a timed long test a couple of weeks ago and failed the test. She just simply could not stay focused on the questions. Current dose certainly helps but not as well as she would like. She is under a lot of stress with 2 jobs. She is not exercising.    Review of Systems     Objective:   Physical Exam  Constitutional: She is oriented to person, place, and time. She appears well-developed and well-nourished.  HENT:  Head: Normocephalic and atraumatic.  Right Ear: External ear normal.  Left Ear: External ear normal.  Nasal turbinates a little more swollen on right nare than left. Nasal septum slight deviation to the the left, could be physiologically or from trauma.   Cardiovascular: Normal rate, regular rhythm and normal heart sounds.   Pulmonary/Chest: Effort normal and breath sounds normal. She has no wheezes.  Neurological: She is alert and oriented to person, place, and time.  Skin: Skin is dry.  Psychiatric: She has a normal mood and affect. Her behavior is normal.          Assessment & Plan:  ADHD- will increase vyvanse to 50mg  for 3 months. Add back in exercising.   Nasal trauma/nasal congestion-reassured pt that there was no overt septum deviation. There was still certainly some swelling. Could be worsened by allergies. Try flonase 2 sprays each nostril daily. We could send to ENT for evaluation but likely with just minor deviation would not do anything. Follow up if congestion not improving or worsening.

## 2014-01-17 ENCOUNTER — Encounter (INDEPENDENT_AMBULATORY_CARE_PROVIDER_SITE_OTHER): Payer: Self-pay

## 2014-01-17 ENCOUNTER — Ambulatory Visit
Admission: RE | Admit: 2014-01-17 | Discharge: 2014-01-17 | Disposition: A | Discharge status: Home / Self Care | Payer: BC Managed Care – PPO | Source: Ambulatory Visit | Attending: Obstetrics and Gynecology | Admitting: Obstetrics and Gynecology

## 2014-01-17 ENCOUNTER — Other Ambulatory Visit: Payer: Self-pay | Admitting: Obstetrics and Gynecology

## 2014-01-17 DIAGNOSIS — N6009 Solitary cyst of unspecified breast: Secondary | ICD-10-CM

## 2014-01-17 LAB — HM MAMMOGRAPHY

## 2014-02-19 ENCOUNTER — Encounter: Payer: Self-pay | Admitting: Physician Assistant

## 2014-02-19 ENCOUNTER — Ambulatory Visit (INDEPENDENT_AMBULATORY_CARE_PROVIDER_SITE_OTHER): Payer: BC Managed Care – PPO

## 2014-02-19 ENCOUNTER — Ambulatory Visit (INDEPENDENT_AMBULATORY_CARE_PROVIDER_SITE_OTHER): Payer: BC Managed Care – PPO | Admitting: Physician Assistant

## 2014-02-19 VITALS — BP 128/76 | HR 89 | Ht 64.0 in | Wt 139.0 lb

## 2014-02-19 DIAGNOSIS — M62830 Muscle spasm of back: Secondary | ICD-10-CM

## 2014-02-19 DIAGNOSIS — M545 Low back pain, unspecified: Secondary | ICD-10-CM

## 2014-02-19 DIAGNOSIS — M5137 Other intervertebral disc degeneration, lumbosacral region: Secondary | ICD-10-CM

## 2014-02-19 DIAGNOSIS — F909 Attention-deficit hyperactivity disorder, unspecified type: Secondary | ICD-10-CM

## 2014-02-19 DIAGNOSIS — M5380 Other specified dorsopathies, site unspecified: Secondary | ICD-10-CM

## 2014-02-19 MED ORDER — LISDEXAMFETAMINE DIMESYLATE 50 MG PO CAPS: 50.0000 mg | ORAL_CAPSULE | Freq: Every day | ORAL | Status: DC

## 2014-02-19 MED ORDER — KETOROLAC TROMETHAMINE 60 MG/2ML IM SOLN
60.0000 mg | Freq: Once | INTRAMUSCULAR | Status: AC
  Administered 2014-02-19: 60 mg via INTRAMUSCULAR

## 2014-02-19 MED ORDER — MELOXICAM 15 MG PO TABS: 15.0000 mg | ORAL_TABLET | Freq: Every day | ORAL | Status: DC

## 2014-02-19 MED ORDER — CYCLOBENZAPRINE HCL 10 MG PO TABS: 10.0000 mg | ORAL_TABLET | Freq: Three times a day (TID) | ORAL | Status: DC | PRN

## 2014-02-19 NOTE — Progress Notes (Signed)
   Subjective:    Patient ID: Teresa Taylor, female    DOB: 11/21/1960, 53 y.o.   MRN: [id number]  HPI Pt presents to the clinic with ongoing right lower back pain off and on since may 20th, 2015. She was going into work when a heavy door shut faster than it should and hit her in the right lower back. Per pt it lifted her up off the grown and set her back down. She did not fall to the ground. She never sought medical attention because she thought it was muscular. She is taking ibuprofen 200mg  as needed with no real benefit. Heat helps the most. She is doing yoga stretching which does not seem to be helping. She feels like it is better then she moves the wrong way and pain increases. Most of the time pain is 3/10 at worse 6/10. Denies any saddle anthesisa, bowel or bladder dysfunction or radiation of pain into or down legs.    ADHD- vyvanse increase is great. No side effects. Doing wonderful. Needs refill.   Review of Systems  All other systems reviewed and are negative.      Objective:   Physical Exam  Constitutional: She is oriented to person, place, and time. She appears well-developed and well-nourished.  HENT:  Head: Normocephalic and atraumatic.  Cardiovascular: Normal rate, regular rhythm and normal heart sounds.   Pulmonary/Chest: Effort normal and breath sounds normal.  Musculoskeletal:  paraspinus tightness over right low back. No lumbar spine tenderness.  Negative straight leg test.  Patellar reflexes symmetric.  ROM at waist limited with flexion and lateral side to side movement due to pain.  Extension of back at waist causes pain to decrease.   Neurological: She is alert and oriented to person, place, and time.  Skin: Skin is dry.  Psychiatric: She has a normal mood and affect. Her behavior is normal.          Assessment & Plan:  Low back pain/muscle spasm- due to injury will get xrays of low back. Suspect muscle spasm of back. Flexeril given to use as needed and at  night. mobic daily for 2 weeks then as needed for pain. Heat 2-3 times a day. Exercise given for pt to start. Will consider PT if not improving in 2 weeks. Shot of toradol given 60mg .IM.  ADHD- refills given for 3 months.

## 2014-02-19 NOTE — Patient Instructions (Signed)
Low Back Sprain with Rehab  A sprain is an injury in which a ligament is torn. The ligaments of the lower back are vulnerable to sprains. However, they are strong and require great force to be injured. These ligaments are important for stabilizing the spinal column. Sprains are classified into three categories. Grade 1 sprains cause pain, but the tendon is not lengthened. Grade 2 sprains include a lengthened ligament, due to the ligament being stretched or partially ruptured. With grade 2 sprains there is still function, although the function may be decreased. Grade 3 sprains involve a complete tear of the tendon or muscle, and function is usually impaired. SYMPTOMS   Severe pain in the lower back.  Sometimes, a feeling of a "pop," "snap," or tear, at the time of injury.  Tenderness and sometimes swelling at the injury site.  Uncommonly, bruising (contusion) within 48 hours of injury.  Muscle spasms in the back. CAUSES  Low back sprains occur when a force is placed on the ligaments that is greater than they can handle. Common causes of injury include:  Performing a stressful act while off-balance.  Repetitive stressful activities that involve movement of the lower back.  Direct hit (trauma) to the lower back. RISK INCREASES WITH:  Contact sports (football, wrestling).  Collisions (major skiing accidents).  Sports that require throwing or lifting (baseball, weightlifting).  Sports involving twisting of the spine (gymnastics, diving, tennis, golf).  Poor strength and flexibility.  Inadequate protection.  Previous back injury or surgery (especially fusion). PREVENTION  Wear properly fitted and padded protective equipment.  Warm up and stretch properly before activity.  Allow for adequate recovery between workouts.  Maintain physical fitness:  Strength, flexibility, and endurance.  Cardiovascular fitness.  Maintain a healthy body weight. PROGNOSIS  If treated  properly, low back sprains usually heal with non-surgical treatment. The length of time for healing depends on the severity of the injury.  RELATED COMPLICATIONS   Recurring symptoms, resulting in a chronic problem.  Chronic inflammation and pain in the low back.  Delayed healing or resolution of symptoms, especially if activity is resumed too soon.  Prolonged impairment.  Unstable or arthritic joints of the low back. TREATMENT  Treatment first involves the use of ice and medicine, to reduce pain and inflammation. The use of strengthening and stretching exercises may help reduce pain with activity. These exercises may be performed at home or with a therapist. Severe injuries may require referral to a therapist for further evaluation and treatment, such as ultrasound. Your caregiver may advise that you wear a back brace or corset, to help reduce pain and discomfort. Often, prolonged bed rest results in greater harm then benefit. Corticosteroid injections may be recommended. However, these should be reserved for the most serious cases. It is important to avoid using your back when lifting objects. At night, sleep on your back on a firm mattress, with a pillow placed under your knees. If non-surgical treatment is unsuccessful, surgery may be needed.  MEDICATION   If pain medicine is needed, nonsteroidal anti-inflammatory medicines (aspirin and ibuprofen), or other minor pain relievers (acetaminophen), are often advised.  Do not take pain medicine for 7 days before surgery.  Prescription pain relievers may be given, if your caregiver thinks they are needed. Use only as directed and only as much as you need.  Ointments applied to the skin may be helpful.  Corticosteroid injections may be given by your caregiver. These injections should be reserved for the most serious cases,   because they may only be given a certain number of times. HEAT AND COLD  Cold treatment (icing) should be applied for 10  to 15 minutes every 2 to 3 hours for inflammation and pain, and immediately after activity that aggravates your symptoms. Use ice packs or an ice massage.  Heat treatment may be used before performing stretching and strengthening activities prescribed by your caregiver, physical therapist, or athletic trainer. Use a heat pack or a warm water soak. SEEK MEDICAL CARE IF:   Symptoms get worse or do not improve in 2 to 4 weeks, despite treatment.  You develop numbness or weakness in either leg.  You lose bowel or bladder function.  Any of the following occur after surgery: fever, increased pain, swelling, redness, drainage of fluids, or bleeding in the affected area.  New, unexplained symptoms develop. (Drugs used in treatment may produce side effects.) EXERCISES  RANGE OF MOTION (ROM) AND STRETCHING EXERCISES - Low Back Sprain Most people with lower back pain will find that their symptoms get worse with excessive bending forward (flexion) or arching at the lower back (extension). The exercises that will help resolve your symptoms will focus on the opposite motion.  Your physician, physical therapist or athletic trainer will help you determine which exercises will be most helpful to resolve your lower back pain. Do not complete any exercises without first consulting with your caregiver. Discontinue any exercises which make your symptoms worse, until you speak to your caregiver. If you have pain, numbness or tingling which travels down into your buttocks, leg or foot, the goal of the therapy is for these symptoms to move closer to your back and eventually resolve. Sometimes, these leg symptoms will get better, but your lower back pain may worsen. This is often an indication of progress in your rehabilitation. Be very alert to any changes in your symptoms and the activities in which you participated in the 24 hours prior to the change. Sharing this information with your caregiver will allow him or her to  most efficiently treat your condition. These exercises may help you when beginning to rehabilitate your injury. Your symptoms may resolve with or without further involvement from your physician, physical therapist or athletic trainer. While completing these exercises, remember:   Restoring tissue flexibility helps normal motion to return to the joints. This allows healthier, less painful movement and activity.  An effective stretch should be held for at least 30 seconds.  A stretch should never be painful. You should only feel a gentle lengthening or release in the stretched tissue. FLEXION RANGE OF MOTION AND STRETCHING EXERCISES: STRETCH - Flexion, Single Knee to Chest   Lie on a firm bed or floor with both legs extended in front of you.  Keeping one leg in contact with the floor, bring your opposite knee to your chest. Hold your leg in place by either grabbing behind your thigh or at your knee.  Pull until you feel a gentle stretch in your low back. Hold __________ seconds.  Slowly release your grasp and repeat the exercise with the opposite side. Repeat __________ times. Complete this exercise __________ times per day.  STRETCH - Flexion, Double Knee to Chest  Lie on a firm bed or floor with both legs extended in front of you.  Keeping one leg in contact with the floor, bring your opposite knee to your chest.  Tense your stomach muscles to support your back and then lift your other knee to your chest. Hold your legs   in place by either grabbing behind your thighs or at your knees.  Pull both knees toward your chest until you feel a gentle stretch in your low back. Hold __________ seconds.  Tense your stomach muscles and slowly return one leg at a time to the floor. Repeat __________ times. Complete this exercise __________ times per day.  STRETCH - Low Trunk Rotation  Lie on a firm bed or floor. Keeping your legs in front of you, bend your knees so they are both pointed toward the  ceiling and your feet are flat on the floor.  Extend your arms out to the side. This will stabilize your upper body by keeping your shoulders in contact with the floor.  Gently and slowly drop both knees together to one side until you feel a gentle stretch in your low back. Hold for __________ seconds.  Tense your stomach muscles to support your lower back as you bring your knees back to the starting position. Repeat the exercise to the other side. Repeat __________ times. Complete this exercise __________ times per day  EXTENSION RANGE OF MOTION AND FLEXIBILITY EXERCISES: STRETCH - Extension, Prone on Elbows   Lie on your stomach on the floor, a bed will be too soft. Place your palms about shoulder width apart and at the height of your head.  Place your elbows under your shoulders. If this is too painful, stack pillows under your chest.  Allow your body to relax so that your hips drop lower and make contact more completely with the floor.  Hold this position for __________ seconds.  Slowly return to lying flat on the floor. Repeat __________ times. Complete this exercise __________ times per day.  RANGE OF MOTION - Extension, Prone Press Ups  Lie on your stomach on the floor, a bed will be too soft. Place your palms about shoulder width apart and at the height of your head.  Keeping your back as relaxed as possible, slowly straighten your elbows while keeping your hips on the floor. You may adjust the placement of your hands to maximize your comfort. As you gain motion, your hands will come more underneath your shoulders.  Hold this position __________ seconds.  Slowly return to lying flat on the floor. Repeat __________ times. Complete this exercise __________ times per day.  RANGE OF MOTION- Quadruped, Neutral Spine   Assume a hands and knees position on a firm surface. Keep your hands under your shoulders and your knees under your hips. You may place padding under your knees for  comfort.  Drop your head and point your tailbone toward the ground below you. This will round out your lower back like an angry cat. Hold this position for __________ seconds.  Slowly lift your head and release your tail bone so that your back sags into a large arch, like an old horse.  Hold this position for __________ seconds.  Repeat this until you feel limber in your low back.  Now, find your "sweet spot." This will be the most comfortable position somewhere between the two previous positions. This is your neutral spine. Once you have found this position, tense your stomach muscles to support your low back.  Hold this position for __________ seconds. Repeat __________ times. Complete this exercise __________ times per day.  STRENGTHENING EXERCISES - Low Back Sprain These exercises may help you when beginning to rehabilitate your injury. These exercises should be done near your "sweet spot." This is the neutral, low-back arch, somewhere between fully rounded   and fully arched, that is your least painful position. When performed in this safe range of motion, these exercises can be used for people who have either a flexion or extension based injury. These exercises may resolve your symptoms with or without further involvement from your physician, physical therapist or athletic trainer. While completing these exercises, remember:   Muscles can gain both the endurance and the strength needed for everyday activities through controlled exercises.  Complete these exercises as instructed by your physician, physical therapist or athletic trainer. Increase the resistance and repetitions only as guided.  You may experience muscle soreness or fatigue, but the pain or discomfort you are trying to eliminate should never worsen during these exercises. If this pain does worsen, stop and make certain you are following the directions exactly. If the pain is still present after adjustments, discontinue the  exercise until you can discuss the trouble with your caregiver. STRENGTHENING - Deep Abdominals, Pelvic Tilt   Lie on a firm bed or floor. Keeping your legs in front of you, bend your knees so they are both pointed toward the ceiling and your feet are flat on the floor.  Tense your lower abdominal muscles to press your low back into the floor. This motion will rotate your pelvis so that your tail bone is scooping upwards rather than pointing at your feet or into the floor. With a gentle tension and even breathing, hold this position for __________ seconds. Repeat __________ times. Complete this exercise __________ times per day.  STRENGTHENING - Abdominals, Crunches   Lie on a firm bed or floor. Keeping your legs in front of you, bend your knees so they are both pointed toward the ceiling and your feet are flat on the floor. Cross your arms over your chest.  Slightly tip your chin down without bending your neck.  Tense your abdominals and slowly lift your trunk high enough to just clear your shoulder blades. Lifting higher can put excessive stress on the lower back and does not further strengthen your abdominal muscles.  Control your return to the starting position. Repeat __________ times. Complete this exercise __________ times per day.  STRENGTHENING - Quadruped, Opposite UE/LE Lift   Assume a hands and knees position on a firm surface. Keep your hands under your shoulders and your knees under your hips. You may place padding under your knees for comfort.  Find your neutral spine and gently tense your abdominal muscles so that you can maintain this position. Your shoulders and hips should form a rectangle that is parallel with the floor and is not twisted.  Keeping your trunk steady, lift your right hand no higher than your shoulder and then your left leg no higher than your hip. Make sure you are not holding your breath. Hold this position for __________ seconds.  Continuing to keep  your abdominal muscles tense and your back steady, slowly return to your starting position. Repeat with the opposite arm and leg. Repeat __________ times. Complete this exercise __________ times per day.  STRENGTHENING - Abdominals and Quadriceps, Straight Leg Raise   Lie on a firm bed or floor with both legs extended in front of you.  Keeping one leg in contact with the floor, bend the other knee so that your foot can rest flat on the floor.  Find your neutral spine, and tense your abdominal muscles to maintain your spinal position throughout the exercise.  Slowly lift your straight leg off the floor about 6 inches for a count   of 15, making sure to not hold your breath.  Still keeping your neutral spine, slowly lower your leg all the way to the floor. Repeat this exercise with each leg __________ times. Complete this exercise __________ times per day. POSTURE AND BODY MECHANICS CONSIDERATIONS - Low Back Sprain Keeping correct posture when sitting, standing or completing your activities will reduce the stress put on different body tissues, allowing injured tissues a chance to heal and limiting painful experiences. The following are general guidelines for improved posture. Your physician or physical therapist will provide you with any instructions specific to your needs. While reading these guidelines, remember:  The exercises prescribed by your provider will help you have the flexibility and strength to maintain correct postures.  The correct posture provides the best environment for your joints to work. All of your joints have less wear and tear when properly supported by a spine with good posture. This means you will experience a healthier, less painful body.  Correct posture must be practiced with all of your activities, especially prolonged sitting and standing. Correct posture is as important when doing repetitive low-stress activities (typing) as it is when doing a single heavy-load  activity (lifting). RESTING POSITIONS Consider which positions are most painful for you when choosing a resting position. If you have pain with flexion-based activities (sitting, bending, stooping, squatting), choose a position that allows you to rest in a less flexed posture. You would want to avoid curling into a fetal position on your side. If your pain worsens with extension-based activities (prolonged standing, working overhead), avoid resting in an extended position such as sleeping on your stomach. Most people will find more comfort when they rest with their spine in a more neutral position, neither too rounded nor too arched. Lying on a non-sagging bed on your side with a pillow between your knees, or on your back with a pillow under your knees will often provide some relief. Keep in mind, being in any one position for a prolonged period of time, no matter how correct your posture, can still lead to stiffness. PROPER SITTING POSTURE In order to minimize stress and discomfort on your spine, you must sit with correct posture. Sitting with good posture should be effortless for a healthy body. Returning to good posture is a gradual process. Many people can work toward this most comfortably by using various supports until they have the flexibility and strength to maintain this posture on their own. When sitting with proper posture, your ears will fall over your shoulders and your shoulders will fall over your hips. You should use the back of the chair to support your upper back. Your lower back will be in a neutral position, just slightly arched. You may place a small pillow or folded towel at the base of your lower back for  support.  When working at a desk, create an environment that supports good, upright posture. Without extra support, muscles tire, which leads to excessive strain on joints and other tissues. Keep these recommendations in mind: CHAIR:  A chair should be able to slide under your desk  when your back makes contact with the back of the chair. This allows you to work closely.  The chair's height should allow your eyes to be level with the upper part of your monitor and your hands to be slightly lower than your elbows. BODY POSITION  Your feet should make contact with the floor. If this is not possible, use a foot rest.  Keep your   ears over your shoulders. This will reduce stress on your neck and low back. INCORRECT SITTING POSTURES  If you are feeling tired and unable to assume a healthy sitting posture, do not slouch or slump. This puts excessive strain on your back tissues, causing more damage and pain. Healthier options include:  Using more support, like a lumbar pillow.  Switching tasks to something that requires you to be upright or walking.  Talking a brief walk.  Lying down to rest in a neutral-spine position. PROLONGED STANDING WHILE SLIGHTLY LEANING FORWARD  When completing a task that requires you to lean forward while standing in one place for a long time, place either foot up on a stationary 2-4 inch high object to help maintain the best posture. When both feet are on the ground, the lower back tends to lose its slight inward curve. If this curve flattens (or becomes too large), then the back and your other joints will experience too much stress, tire more quickly, and can cause pain. CORRECT STANDING POSTURES Proper standing posture should be assumed with all daily activities, even if they only take a few moments, like when brushing your teeth. As in sitting, your ears should fall over your shoulders and your shoulders should fall over your hips. You should keep a slight tension in your abdominal muscles to brace your spine. Your tailbone should point down to the ground, not behind your body, resulting in an over-extended swayback posture.  INCORRECT STANDING POSTURES  Common incorrect standing postures include a forward head, locked knees and/or an excessive  swayback. WALKING Walk with an upright posture. Your ears, shoulders and hips should all line-up. PROLONGED ACTIVITY IN A FLEXED POSITION When completing a task that requires you to bend forward at your waist or lean over a low surface, try to find a way to stabilize 3 out of 4 of your limbs. You can place a hand or elbow on your thigh or rest a knee on the surface you are reaching across. This will provide you more stability, so that your muscles do not tire as quickly. By keeping your knees relaxed, or slightly bent, you will also reduce stress across your lower back. CORRECT LIFTING TECHNIQUES DO :  Assume a wide stance. This will provide you more stability and the opportunity to get as close as possible to the object which you are lifting.  Tense your abdominals to brace your spine. Bend at the knees and hips. Keeping your back locked in a neutral-spine position, lift using your leg muscles. Lift with your legs, keeping your back straight.  Test the weight of unknown objects before attempting to lift them.  Try to keep your elbows locked down at your sides in order get the best strength from your shoulders when carrying an object.  Always ask for help when lifting heavy or awkward objects. INCORRECT LIFTING TECHNIQUES DO NOT:   Lock your knees when lifting, even if it is a small object.  Bend and twist. Pivot at your feet or move your feet when needing to change directions.  Assume that you can safely pick up even a paperclip without proper posture. Document Released: 08/22/2005 Document Revised: 11/14/2011 Document Reviewed: 12/04/2008 [employer] Patient Information 2015 [employer]. This information is not intended to replace advice given to you by your health care provider. Make sure you discuss any questions you have with your health care provider.  

## 2014-02-21 DIAGNOSIS — M545 Low back pain, unspecified: Secondary | ICD-10-CM | POA: Insufficient documentation

## 2014-02-21 DIAGNOSIS — M62830 Muscle spasm of back: Secondary | ICD-10-CM | POA: Insufficient documentation

## 2014-08-18 ENCOUNTER — Ambulatory Visit (INDEPENDENT_AMBULATORY_CARE_PROVIDER_SITE_OTHER): Payer: BC Managed Care – PPO | Admitting: Physician Assistant

## 2014-08-18 ENCOUNTER — Encounter: Payer: Self-pay | Admitting: Physician Assistant

## 2014-08-18 VITALS — BP 121/70 | HR 81 | Ht 64.0 in | Wt 144.0 lb

## 2014-08-18 DIAGNOSIS — F900 Attention-deficit hyperactivity disorder, predominantly inattentive type: Secondary | ICD-10-CM | POA: Diagnosis not present

## 2014-08-18 DIAGNOSIS — Z131 Encounter for screening for diabetes mellitus: Secondary | ICD-10-CM

## 2014-08-18 DIAGNOSIS — Z13220 Encounter for screening for lipoid disorders: Secondary | ICD-10-CM

## 2014-08-18 DIAGNOSIS — G479 Sleep disorder, unspecified: Secondary | ICD-10-CM

## 2014-08-18 DIAGNOSIS — R5383 Other fatigue: Secondary | ICD-10-CM

## 2014-08-18 DIAGNOSIS — Z0000 Encounter for general adult medical examination without abnormal findings: Secondary | ICD-10-CM

## 2014-08-18 LAB — TSH: TSH: 2.614 u[IU]/mL (ref 0.350–4.500)

## 2014-08-18 LAB — COMPLETE METABOLIC PANEL WITH GFR
ALBUMIN: 4 g/dL (ref 3.5–5.2)
ALT: 13 U/L (ref 0–35)
AST: 17 U/L (ref 0–37)
Alkaline Phosphatase: 74 U/L (ref 39–117)
BUN: 13 mg/dL (ref 6–23)
CO2: 23 mEq/L (ref 19–32)
Calcium: 8.9 mg/dL (ref 8.4–10.5)
Chloride: 105 mEq/L (ref 96–112)
Creat: 0.88 mg/dL (ref 0.50–1.10)
GFR, EST AFRICAN AMERICAN: 87 mL/min
GFR, EST NON AFRICAN AMERICAN: 76 mL/min
GLUCOSE: 84 mg/dL (ref 70–99)
POTASSIUM: 4 meq/L (ref 3.5–5.3)
SODIUM: 137 meq/L (ref 135–145)
Total Bilirubin: 0.6 mg/dL (ref 0.2–1.2)
Total Protein: 7 g/dL (ref 6.0–8.3)

## 2014-08-18 LAB — CBC WITH DIFFERENTIAL/PLATELET
Basophils Absolute: 0.1 10*3/uL (ref 0.0–0.1)
Basophils Relative: 1 % (ref 0–1)
EOS ABS: 0.6 10*3/uL (ref 0.0–0.7)
EOS PCT: 7 % — AB (ref 0–5)
HEMATOCRIT: 34.7 % — AB (ref 36.0–46.0)
Hemoglobin: 11 g/dL — ABNORMAL LOW (ref 12.0–15.0)
Lymphocytes Relative: 26 % (ref 12–46)
Lymphs Abs: 2.3 10*3/uL (ref 0.7–4.0)
MCH: 20.9 pg — ABNORMAL LOW (ref 26.0–34.0)
MCHC: 31.7 g/dL (ref 30.0–36.0)
MCV: 65.8 fL — ABNORMAL LOW (ref 78.0–100.0)
MONOS PCT: 7 % (ref 3–12)
Monocytes Absolute: 0.6 10*3/uL (ref 0.1–1.0)
NEUTROS PCT: 59 % (ref 43–77)
Neutro Abs: 5.3 10*3/uL (ref 1.7–7.7)
Platelets: 242 10*3/uL (ref 150–400)
RBC: 5.27 MIL/uL — ABNORMAL HIGH (ref 3.87–5.11)
RDW: 16.1 % — AB (ref 11.5–15.5)
WBC: 9 10*3/uL (ref 4.0–10.5)

## 2014-08-18 LAB — LIPID PANEL
Cholesterol: 168 mg/dL (ref 0–200)
HDL: 51 mg/dL (ref >39)
LDL CALC: 95 mg/dL (ref 0–99)
Total CHOL/HDL Ratio: 3.3 Ratio
Triglycerides: 108 mg/dL (ref <150)
VLDL: 22 mg/dL (ref 0–40)

## 2014-08-18 LAB — FERRITIN: FERRITIN: 67 ng/mL (ref 10–291)

## 2014-08-18 LAB — VITAMIN B12: Vitamin B-12: 423 pg/mL (ref 211–911)

## 2014-08-18 MED ORDER — LISDEXAMFETAMINE DIMESYLATE 50 MG PO CAPS: 50.0000 mg | ORAL_CAPSULE | Freq: Every day | ORAL | Status: DC

## 2014-08-18 NOTE — Progress Notes (Signed)
   Subjective:     Teresa Taylor is a 53 y.o. female and is here for a comprehensive physical exam. The patient reports problems - Patient would like refill on her Vyvanse. She has had trouble getting it refilled. She has not been on for the last couple months and noticing more fatigue lack of motivation and not getting her activities done. She is also noticing more trouble going to sleep at night because her mind is racing.Marland Kitchen.  History   Social History  . Marital Status: Divorced    Spouse Name: N/A    Number of Children: N/A  . Years of Education: N/A   Occupational History  . Not on file.   Social History Main Topics  . Smoking status: Never Smoker   . Smokeless tobacco: Not on file  . Alcohol Use: Not on file  . Drug Use: Not on file  . Sexual Activity: Not on file   Other Topics Concern  . Not on file   Social History Narrative   Health Maintenance  Topic Date Due  . COLONOSCOPY  12/07/2011  . INFLUENZA VACCINE  04/05/2014  . PAP SMEAR  01/04/2016  . MAMMOGRAM  01/18/2016  . TETANUS/TDAP  12/07/2021    The following portions of the patient's history were reviewed and updated as appropriate: allergies, current medications, past family history, past medical history, past social history, past surgical history and problem list.  Review of Systems A comprehensive review of systems was negative.   Objective:    BP 121/70 mmHg  Pulse 81  Ht 5\' 4"  (1.626 m)  Wt 144 lb (65.318 kg)  BMI 24.71 kg/m2 General appearance: alert, cooperative and appears stated age Head: Normocephalic, without obvious abnormality, atraumatic Eyes: conjunctivae/corneas clear. PERRL, EOM's intact. Fundi benign. Ears: normal TM's and external ear canals both ears Nose: Nares normal. Septum midline. Mucosa normal. No drainage or sinus tenderness. Throat: lips, mucosa, and tongue normal; teeth and gums normal Neck: no adenopathy, no carotid bruit, no JVD, supple, symmetrical, trachea midline and  thyroid not enlarged, symmetric, no tenderness/mass/nodules Back: symmetric, no curvature. ROM normal. No CVA tenderness. Lungs: clear to auscultation bilaterally Heart: regular rate and rhythm, S1, S2 normal, no murmur, click, rub or gallop Abdomen: soft, non-tender; bowel sounds normal; no masses,  no organomegaly Extremities: extremities normal, atraumatic, no cyanosis or edema Pulses: 2+ and symmetric Skin: Skin color, texture, turgor normal. No rashes or lesions Lymph nodes: Cervical, supraclavicular, and axillary nodes normal. Neurologic: Grossly normal    Assessment:    Healthy female exam.      Plan:     CPE-screening labs were done today. Discussed calcium and vitamin D. Encourage regular exercise.  ADHD-Vyvanse refill.  Sleeping disturbance/fatigue-labs order to evaluate. PHQ-9 was 3. Likely not depression.Discussed starting melatonin one hour before bedtime. Follow-up for another office visit to discuss. See After Visit Summary for Counseling Recommendations

## 2014-08-18 NOTE — Patient Instructions (Addendum)
Melatonin 10mg .  Effexor.      Insomnia Insomnia is frequent trouble falling and/or staying asleep. Insomnia can be a long term problem or a short term problem. Both are common. Insomnia can be a short term problem when the wakefulness is related to a certain stress or worry. Long term insomnia is often related to ongoing stress during waking hours and/or poor sleeping habits. Overtime, sleep deprivation itself can make the problem worse. Every little thing feels more severe because you are overtired and your ability to cope is decreased. CAUSES   Stress, anxiety, and depression.  Poor sleeping habits.  Distractions such as TV in the bedroom.  Naps close to bedtime.  Engaging in emotionally charged conversations before bed.  Technical reading before sleep.  Alcohol and other sedatives. They may make the problem worse. They can hurt normal sleep patterns and normal dream activity.  Stimulants such as caffeine for several hours prior to bedtime.  Pain syndromes and shortness of breath can cause insomnia.  Exercise late at night.  Changing time zones may cause sleeping problems (jet lag). It is sometimes helpful to have someone observe your sleeping patterns. They should look for periods of not breathing during the night (sleep apnea). They should also look to see how long those periods last. If you live alone or observers are uncertain, you can also be observed at a sleep clinic where your sleep patterns will be professionally monitored. Sleep apnea requires a checkup and treatment. Give your caregivers your medical history. Give your caregivers observations your family has made about your sleep.  SYMPTOMS   Not feeling rested in the morning.  Anxiety and restlessness at bedtime.  Difficulty falling and staying asleep. TREATMENT   Your caregiver may prescribe treatment for an underlying medical disorders. Your caregiver can give advice or help if you are using alcohol or other  drugs for self-medication. Treatment of underlying problems will usually eliminate insomnia problems.  Medications can be prescribed for short time use. They are generally not recommended for lengthy use.  Over-the-counter sleep medicines are not recommended for lengthy use. They can be habit forming.  You can promote easier sleeping by making lifestyle changes such as:  Using relaxation techniques that help with breathing and reduce muscle tension.  Exercising earlier in the day.  Changing your diet and the time of your last meal. No night time snacks.  Establish a regular time to go to bed.  Counseling can help with stressful problems and worry.  Soothing music and white noise may be helpful if there are background noises you cannot remove.  Stop tedious detailed work at least one hour before bedtime. HOME CARE INSTRUCTIONS   Keep a diary. Inform your caregiver about your progress. This includes any medication side effects. See your caregiver regularly. Take note of:  Times when you are asleep.  Times when you are awake during the night.  The quality of your sleep.  How you feel the next day. This information will help your caregiver care for you.  Get out of bed if you are still awake after 15 minutes. Read or do some quiet activity. Keep the lights down. Wait until you feel sleepy and go back to bed.  Keep regular sleeping and waking hours. Avoid naps.  Exercise regularly.  Avoid distractions at bedtime. Distractions include watching television or engaging in any intense or detailed activity like attempting to balance the household checkbook.  Develop a bedtime ritual. Keep a familiar routine of bathing,  brushing your teeth, climbing into bed at the same time each night, listening to soothing music. Routines increase the success of falling to sleep faster.  Use relaxation techniques. This can be using breathing and muscle tension release routines. It can also include  visualizing peaceful scenes. You can also help control troubling or intruding thoughts by keeping your mind occupied with boring or repetitive thoughts like the old concept of counting sheep. You can make it more creative like imagining planting one beautiful flower after another in your backyard garden.  During your day, work to eliminate stress. When this is not possible use some of the previous suggestions to help reduce the anxiety that accompanies stressful situations. MAKE SURE YOU:   Understand these instructions.  Will watch your condition.  Will get help right away if you are not doing well or get worse. Document Released: 08/19/2000 Document Revised: 11/14/2011 Document Reviewed: 09/19/2007 [facility] Patient Information 2015 [employer] [place]. This information is not intended to replace advice given to you by your health care provider. Make sure you discuss any questions you have with your health care provider.

## 2014-08-19 LAB — VITAMIN D 25 HYDROXY (VIT D DEFICIENCY, FRACTURES): VIT D 25 HYDROXY: 39 ng/mL (ref 30–100)

## 2014-08-20 ENCOUNTER — Telehealth: Payer: Self-pay | Admitting: *Deleted

## 2014-08-20 NOTE — Telephone Encounter (Signed)
[employer] vyvanse approval 08/14/14-09/04/2038. [facility] and patient notified. Corliss SkainsJamie Shareena Nusz, CMA

## 2014-11-12 ENCOUNTER — Telehealth: Payer: Self-pay

## 2014-11-12 NOTE — Telephone Encounter (Signed)
Received fax for Pa on vyvance faxed over to [facility] waiting on auth. - CF

## 2014-11-12 NOTE — Telephone Encounter (Signed)
Received fax authorizing Vyvanse reference # [id number] Berkley Harveyauth is good until 11/12/2015 - CF

## 2015-02-27 ENCOUNTER — Encounter: Payer: Self-pay | Admitting: Family Medicine

## 2015-02-27 ENCOUNTER — Ambulatory Visit (INDEPENDENT_AMBULATORY_CARE_PROVIDER_SITE_OTHER): Payer: 59 | Admitting: Family Medicine

## 2015-02-27 VITALS — BP 125/75 | HR 77 | Wt 144.0 lb

## 2015-02-27 DIAGNOSIS — F900 Attention-deficit hyperactivity disorder, predominantly inattentive type: Secondary | ICD-10-CM

## 2015-02-27 DIAGNOSIS — R635 Abnormal weight gain: Secondary | ICD-10-CM

## 2015-02-27 MED ORDER — METHYLPHENIDATE HCL ER (OSM) 27 MG PO TBCR
27.0000 mg | EXTENDED_RELEASE_TABLET | ORAL | Status: DC
Start: 2015-02-27 — End: 2015-04-01

## 2015-02-27 NOTE — Progress Notes (Signed)
CC: Teresa Taylor is a 54 y.o. female is here for f/u ADD   Subjective: HPI:  Follow-up ADHD: She had to switch insurance providers and is now using [place] [employer]. They would not cover Vyvanse so she tried to space out her dosing since her last refill back in the spring. She ran out this medication a few weeks ago and has noticed that life is getting more complicated. She is having difficulty organizing her thoughts to be productive during the day. She reports she is constantly distracted and switching from one task to another every minute ultimately never getting anything done. These symptoms were absent when taking Vyvanse. She wants another soap and she can take other than Vyvanse to help with the symptoms. She denies any anxiety or depression nor any other mental disturbance. Review of systems positive for 14 pound weight gain over the past 4 months. Denies chest pain, irregular heartbeat, sleep disturbance, nausea, vomiting, abdominal discomfort constipation or diarrhea   Review Of Systems Outlined In HPI  Past Medical History  Diagnosis Date  . Allergy     No past surgical history on file. Family History  Problem Relation Age of Onset  . Supraventricular tachycardia Mother   . Arthritis Father   . Hypertension Maternal Grandmother   . Stroke Maternal Grandmother   . Dementia Maternal Grandmother     History   Social History  . Marital Status: Divorced    Spouse Name: N/A  . Number of Children: N/A  . Years of Education: N/A   Occupational History  . Not on file.   Social History Main Topics  . Smoking status: Never Smoker   . Smokeless tobacco: Not on file  . Alcohol Use: Not on file  . Drug Use: Not on file  . Sexual Activity: Not on file   Other Topics Concern  . Not on file   Social History Narrative     Objective: BP 125/75 mmHg  Pulse 77  Wt 144 lb (65.318 kg)  Vital signs reviewed. General: Alert and Oriented, No Acute Distress HEENT: Pupils  equal, round, reactive to light. Conjunctivae clear.  External ears unremarkable.  Moist mucous membranes. Lungs: Clear and comfortable work of breathing, speaking in full sentences without accessory muscle use. Cardiac: Regular rate and rhythm.  Neuro: CN II-XII grossly intact, gait normal. Extremities: No peripheral edema.  Strong peripheral pulses.  Mental Status: No depression, anxiety, nor agitation. Logical though process. Skin: Warm and dry.  Assessment & Plan: Teresa Taylor was seen today for f/u add.  Diagnoses and all orders for this visit:  Attention deficit hyperactivity disorder (ADHD), predominantly inattentive type Orders: -     methylphenidate (CONCERTA) 27 MG PO CR tablet; Take 1 tablet (27 mg total) by mouth every morning.  Unintended weight gain Orders: -     TSH   ADHD: Uncontrolled chronic condition her insurance woke up her Adderall XR or Concerta. Switching to Concerta and encourage her to call me in about a month if she wants refills for the next 2 months. If symptoms are not improved after 1 week call me and I will increase the dose. 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 Will rule out hypothyroidism before jumping to conclusions.  Return in about 3 months (around 05/30/2015).

## 2015-02-28 LAB — TSH: TSH: 2.195 u[IU]/mL (ref 0.350–4.500)

## 2015-03-02 ENCOUNTER — Telehealth: Payer: Self-pay | Admitting: *Deleted

## 2015-03-02 ENCOUNTER — Telehealth: Payer: Self-pay | Admitting: Physician Assistant

## 2015-03-02 NOTE — Telephone Encounter (Signed)
Received fax for prior authorization on Concerta. [facility] and the medication is approved from 03/02/2015 - 03/01/2016 or until coverage for medication is no longer available. [id number] - CF

## 2015-03-02 NOTE — Telephone Encounter (Signed)
Pharm is requesting a PA on concerta for pt

## 2015-03-02 NOTE — Telephone Encounter (Signed)
Prior authorization has already been done and medication is approved - CF

## 2015-03-20 ENCOUNTER — Telehealth: Payer: Self-pay | Admitting: *Deleted

## 2015-03-20 NOTE — Telephone Encounter (Signed)
Pt called and left a message stating the "adderall dose" is too low and wanted to know if dose could be increased. I called and left a message on her vm to verify that she meant the concerta and not adderall.Pretty sure she meant concerta but wanted to verify.

## 2015-04-01 MED ORDER — METHYLPHENIDATE HCL ER (OSM) 36 MG PO TBCR: 36.0000 mg | EXTENDED_RELEASE_TABLET | Freq: Every day | ORAL | Status: DC

## 2015-04-01 NOTE — Telephone Encounter (Signed)
Pt apologizes for just now returning call but she states she just needed to know that since she feels her concerta dose is too low she wants to know if this can be increased.please advise

## 2015-04-01 NOTE — Telephone Encounter (Signed)
Teresa Taylor, Rx placed in in-box ready for pickup/faxing.  

## 2015-04-01 NOTE — Telephone Encounter (Signed)
Pt notified and rx up front 

## 2015-05-12 ENCOUNTER — Telehealth: Payer: Self-pay | Admitting: *Deleted

## 2015-05-12 NOTE — Telephone Encounter (Signed)
Pt is calling requesting a refill on concerta. She states she  would like  multiple rx's written to last for next couple of months.Pt states the increased dose that DrMarland Kitchen Ivan Anchors last rx'ed has worked for her ( concerta 36 mg).

## 2015-05-13 ENCOUNTER — Other Ambulatory Visit: Payer: Self-pay | Admitting: Physician Assistant

## 2015-05-13 MED ORDER — METHYLPHENIDATE HCL ER (OSM) 36 MG PO TBCR: 36.0000 mg | EXTENDED_RELEASE_TABLET | Freq: Every day | ORAL | Status: DC

## 2015-05-13 NOTE — Telephone Encounter (Signed)
I printed 2 months and ready for pick up. Needs follow up visit after. We need visits every 3-4 months via law.

## 2015-08-05 ENCOUNTER — Other Ambulatory Visit: Payer: Self-pay

## 2015-08-05 MED ORDER — METHYLPHENIDATE HCL ER (OSM) 36 MG PO TBCR: 36.0000 mg | EXTENDED_RELEASE_TABLET | Freq: Every day | ORAL | Status: DC

## 2015-08-05 NOTE — Telephone Encounter (Signed)
Pt is requesting a refill of concerta. She is due for a f/u visit.

## 2015-09-18 ENCOUNTER — Ambulatory Visit (INDEPENDENT_AMBULATORY_CARE_PROVIDER_SITE_OTHER): Payer: BLUE CROSS/BLUE SHIELD | Admitting: Physician Assistant

## 2015-09-18 ENCOUNTER — Telehealth: Payer: Self-pay | Admitting: Physician Assistant

## 2015-09-18 ENCOUNTER — Encounter: Payer: Self-pay | Admitting: Physician Assistant

## 2015-09-18 VITALS — BP 135/81 | HR 75 | Ht 64.0 in | Wt 145.0 lb

## 2015-09-18 DIAGNOSIS — R5383 Other fatigue: Secondary | ICD-10-CM | POA: Diagnosis not present

## 2015-09-18 DIAGNOSIS — Z0000 Encounter for general adult medical examination without abnormal findings: Secondary | ICD-10-CM

## 2015-09-18 DIAGNOSIS — H9312 Tinnitus, left ear: Secondary | ICD-10-CM

## 2015-09-18 DIAGNOSIS — F329 Major depressive disorder, single episode, unspecified: Secondary | ICD-10-CM | POA: Diagnosis not present

## 2015-09-18 DIAGNOSIS — R635 Abnormal weight gain: Secondary | ICD-10-CM | POA: Diagnosis not present

## 2015-09-18 DIAGNOSIS — D509 Iron deficiency anemia, unspecified: Secondary | ICD-10-CM

## 2015-09-18 DIAGNOSIS — F32A Depression, unspecified: Secondary | ICD-10-CM

## 2015-09-18 MED ORDER — METHYLPHENIDATE HCL ER (OSM) 36 MG PO TBCR: 36.0000 mg | EXTENDED_RELEASE_TABLET | Freq: Every day | ORAL | Status: DC

## 2015-09-18 MED ORDER — VILAZODONE HCL 20 MG PO TABS: 1.0000 | ORAL_TABLET | Freq: Every day | ORAL | Status: DC

## 2015-09-18 NOTE — Patient Instructions (Signed)

## 2015-09-18 NOTE — Progress Notes (Signed)
Subjective:    Patient ID: Teresa Taylor, female    DOB: 09/07/1960, 55 y.o.   MRN: [id number]  HPI  Pt tried wellbutrin/prozac  Review of Systems     Objective:   Physical Exam        Assessment & Plan:   Subjective:     Teresa Taylor is a 55 y.o. female and is here for a comprehensive physical exam. The patient reports problems - pt is very fatigued and gaining weight. she was seen by gyn recently and labs were drawn but all found normal. she has never had problems with weight and concerned. she admits to not exercising as much but has a great diet. for the last month she has had some ringing that is fairly constant in left ear. no pain. in labs she was anemic at GYN. Started daily oral iron. Needs checked today.   Social History   Social History  . Marital Status: Divorced    Spouse Name: N/A  . Number of Children: N/A  . Years of Education: N/A   Occupational History  . Not on file.   Social History Main Topics  . Smoking status: Never Smoker   . Smokeless tobacco: Not on file  . Alcohol Use: Not on file  . Drug Use: Not on file  . Sexual Activity: Not on file   Other Topics Concern  . Not on file   Social History Narrative   Health Maintenance  Topic Date Due  . Hepatitis C Screening  03/01/1962  . HIV Screening  12/06/1976  . COLONOSCOPY  12/07/2011  . INFLUENZA VACCINE  09/17/2016 (Originally 04/06/2015)  . PAP SMEAR  01/04/2016  . MAMMOGRAM  01/18/2016  . TETANUS/TDAP  12/07/2021    The following portions of the patient's history were reviewed and updated as appropriate: allergies, current medications, past family history, past medical history, past social history, past surgical history and problem list.  Review of Systems Pertinent items noted in HPI and remainder of comprehensive ROS otherwise negative.   Objective:    BP 135/81 mmHg  Pulse 75  Ht 5\' 4"  (1.626 m)  Wt 145 lb (65.772 kg)  BMI 24.88 kg/m2 General appearance: alert,  cooperative and appears stated age Head: Normocephalic, without obvious abnormality, atraumatic Eyes: conjunctivae/corneas clear. PERRL, EOM's intact. Fundi benign. Ears: normal TM's and external ear canals both ears Nose: Nares normal. Septum midline. Mucosa normal. No drainage or sinus tenderness. Throat: lips, mucosa, and tongue normal; teeth and gums normal Neck: no adenopathy, no carotid bruit, no JVD, supple, symmetrical, trachea midline and thyroid not enlarged, symmetric, no tenderness/mass/nodules Back: symmetric, no curvature. ROM normal. No CVA tenderness. Lungs: clear to auscultation bilaterally Heart: regular rate and rhythm, S1, S2 normal, no murmur, click, rub or gallop Abdomen: soft, non-tender; bowel sounds normal; no masses,  no organomegaly Extremities: extremities normal, atraumatic, no cyanosis or edema Pulses: 2+ and symmetric Skin: Skin color, texture, turgor normal. No rashes or lesions Lymph nodes: Cervical, supraclavicular, and axillary nodes normal. Neurologic: Grossly normal    Assessment:    Healthy female exam.      Plan:    cPE- labs drawn from GYN. [facility]. Discussed vitamin D 800 units and calcium 1500mg  daily. Encouraged exercise.   Left tinnitus- hearing today normal. Try flonase for next 2 weeks. HO given.   IDA- will recheck today.   Depression/Fatigue- this could contribute to fatigue. PHQ-9 was 18. Tired and failed wellbutrin/prozac. Start  viibryd. Discussed side effects. Follow up in 4-6 weeks.   Abnormal weight gain- on stimulant would not qualify for phentermine. BMI not higher than 27 for other weight loss drugs. Discussed adding exercise back in with a detox to help reboot metabolism.  See After Visit Summary for Counseling Recommendations

## 2015-09-18 NOTE — Telephone Encounter (Signed)
Received fax for prior authorization on Viibryd sent through cover my meds waiting on authorization. - CF

## 2015-09-18 NOTE — Telephone Encounter (Signed)
Received approval through cover my meds. The medication is approved from 09/18/2015 - 09/04/2038. Pharmacy has been notified. -CF

## 2015-09-21 ENCOUNTER — Telehealth: Payer: Self-pay | Admitting: Physician Assistant

## 2015-09-21 NOTE — Telephone Encounter (Signed)
[facility] and pap results. Not able to view in system.

## 2015-09-23 NOTE — Telephone Encounter (Signed)
Done

## 2015-10-23 ENCOUNTER — Ambulatory Visit: Payer: BLUE CROSS/BLUE SHIELD | Admitting: Physician Assistant

## 2015-10-26 ENCOUNTER — Ambulatory Visit (INDEPENDENT_AMBULATORY_CARE_PROVIDER_SITE_OTHER): Payer: BLUE CROSS/BLUE SHIELD | Admitting: Physician Assistant

## 2015-10-26 ENCOUNTER — Encounter: Payer: Self-pay | Admitting: Physician Assistant

## 2015-10-26 VITALS — BP 141/66 | HR 79 | Ht 64.0 in | Wt 149.0 lb

## 2015-10-26 DIAGNOSIS — F329 Major depressive disorder, single episode, unspecified: Secondary | ICD-10-CM

## 2015-10-26 DIAGNOSIS — F900 Attention-deficit hyperactivity disorder, predominantly inattentive type: Secondary | ICD-10-CM

## 2015-10-26 DIAGNOSIS — F411 Generalized anxiety disorder: Secondary | ICD-10-CM | POA: Diagnosis not present

## 2015-10-26 DIAGNOSIS — R635 Abnormal weight gain: Secondary | ICD-10-CM

## 2015-10-26 DIAGNOSIS — F32A Depression, unspecified: Secondary | ICD-10-CM

## 2015-10-26 MED ORDER — VILAZODONE HCL 20 MG PO TABS: 1.0000 | ORAL_TABLET | Freq: Every day | ORAL | Status: DC

## 2015-10-26 NOTE — Patient Instructions (Signed)
Dr. Cathey Endow in [place].  [place] off [address]- weight loss

## 2015-10-27 DIAGNOSIS — F329 Major depressive disorder, single episode, unspecified: Secondary | ICD-10-CM | POA: Insufficient documentation

## 2015-10-27 DIAGNOSIS — F32A Depression, unspecified: Secondary | ICD-10-CM | POA: Insufficient documentation

## 2015-10-27 NOTE — Progress Notes (Signed)
   Subjective:    Patient ID: Teresa Taylor, female    DOB: Jul 18, 1961, 55 y.o.   MRN: [id number]  HPI  Pt is a 55 yo female who presents to the clinic for anxiety and depression follow up. She loves viibryd. She is doing great. In fact she is not taking concerta because viibryd allows her to focus, be clear headed, motivated, and decreased anxiety. She did try to go up to  and felt more jittery. She went back down to  and feels great. 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     Review of Systems  All other systems reviewed and are negative.      Objective:   Physical Exam  Constitutional: She is oriented to person, place, and time. She appears well-developed and well-nourished.  HENT:  Head: Normocephalic and atraumatic.  Cardiovascular: Normal rate, regular rhythm and normal heart sounds.   Pulmonary/Chest: Effort normal and breath sounds normal.  Neurological: She is alert and oriented to person, place, and time.  Psychiatric: She has a normal mood and affect. Her behavior is normal.          Assessment & Plan:  ADHD- not currently on concerta because viibryd helped all her ADHD symptoms.   Depression/GAD- PHQ-9 was 8 decreased from 19 at last visit. GAD-7 was 3.  Remain on  of Viibryd daily. Follow up in 6 months.   Abnormal weight gain- BMI still normal but has gained 4lbs in last month. She does not eat too much. She keeps to a 1200 calorie diet daily. She exercises daily. She is a Marketing executive. TSH was good. Unclear what she can do to lose weight. I do not think viibryd is causing weight gain. Discussed detox and changing up exercise routine to more interval training. Discussed going to Dr. Cathey Endow to discuss diet and weight gain. I do not want to give weight loss medication since pt already is eating 1200 calories a day.

## 2016-01-08 ENCOUNTER — Encounter: Payer: Self-pay | Admitting: Family Medicine

## 2016-01-08 ENCOUNTER — Ambulatory Visit (INDEPENDENT_AMBULATORY_CARE_PROVIDER_SITE_OTHER): Payer: BLUE CROSS/BLUE SHIELD | Admitting: Family Medicine

## 2016-01-08 VITALS — BP 153/118 | HR 99 | Wt 148.0 lb

## 2016-01-08 DIAGNOSIS — K224 Dyskinesia of esophagus: Secondary | ICD-10-CM | POA: Diagnosis not present

## 2016-01-08 MED ORDER — LORAZEPAM 2 MG/ML IJ SOLN
2.0000 mg | Freq: Once | INTRAMUSCULAR | Status: AC
  Administered 2016-01-08: 2 mg via INTRAMUSCULAR

## 2016-01-08 MED ORDER — ONDANSETRON HCL 4 MG/2ML IJ SOLN
4.0000 mg | Freq: Once | INTRAMUSCULAR | Status: AC
  Administered 2016-01-08: 4 mg via INTRAMUSCULAR

## 2016-01-08 NOTE — Progress Notes (Signed)
CC: Teresa Taylor is a 55 y.o. female is here for Choking   Subjective: HPI:  Around noon this afternoon she was eating boneless chicken and potato wedges. She felt that some combination of this meal got caught in her esophagus and she felt that it was stuck behind her sternum. It was causing her to gag and soon she regurgitated what she thought was the majority of the food. Since then she's continued to have difficulty swallowing her keeping anything down for more than a few seconds. She's tried drinking large amounts water but this comes right back up. She is constantly having to spit into a spittoon. She had a nosebleed a few hours ago but it stopped on its own within a few minutes and she denies any blood in her sputum. She's never had this before. It feels like it's still stuck just below the sternum. She denies any exertional component to her pain. She denies any shortness of breath or pulmonary complaints. She denies any abdominal pain other than that described above. Pain is nonradiating. And equal in the   Review Of Systems Outlined In HPI  Past Medical History  Diagnosis Date  . Allergy     No past surgical history on file. Family History  Problem Relation Age of Onset  . Supraventricular tachycardia Mother   . Arthritis Father   . Hypertension Maternal Grandmother   . Stroke Maternal Grandmother   . Dementia Maternal Grandmother     Social History   Social History  . Marital Status: Divorced    Spouse Name: N/A  . Number of Children: N/A  . Years of Education: N/A   Occupational History  . Not on file.   Social History Main Topics  . Smoking status: Never Smoker   . Smokeless tobacco: Not on file  . Alcohol Use: Not on file  . Drug Use: Not on file  . Sexual Activity: Not on file   Other Topics Concern  . Not on file   Social History Narrative     Objective: BP 153/118 mmHg  Pulse 99  Wt 148 lb (67.132 kg)  General: Alert and Oriented, No Acute  Distress HEENT: Pupils equal, round, reactive to light. Conjunctivae clear.  External ears unremarkable,Moist mucous membranes, pharynx without inflammation nor lesions.  Neck supple without palpable lymphadenopathy nor abnormal masses. Lungs: Clear to auscultation bilaterally, no wheezing/ronchi/rales.  Comfortable work of breathing. Good air movement. Cardiac: Regular rate and rhythm. Extremities: No peripheral edema.  Strong peripheral pulses.  Mental Status: No depression, anxiety, nor agitation. Skin: Warm and dry.  Assessment & Plan: Teresa Taylor was seen today for choking.  Diagnoses and all orders for this visit:  Esophageal spasm   Discussed that she may need actually be seen in the emergency room tonight if were not able to be creative and get her feeling better within the next 2 hours or if symptoms decline.  She has a driver that can drive her home today and watch over her. She'll be receiving 2 mg of Ativan and 4 mg of Zofran IM today. She expresses understanding and the plan. Also advised not to fall asleep lying down tonight rather keep the torso elevated.  25 minutes spent face-to-face during visit today of which at least 50% was counseling or coordinating care regarding: 1. Esophageal spasm      Return if symptoms worsen or fail to improve.

## 2016-01-08 NOTE — Addendum Note (Signed)
Addended by: Collie SiadICHARDSON, Smera Guyette M on: 01/08/2016 03:36 PM   Modules accepted: Orders

## 2016-04-08 ENCOUNTER — Other Ambulatory Visit: Payer: Self-pay | Admitting: Physician Assistant

## 2016-04-08 ENCOUNTER — Encounter: Payer: Self-pay | Admitting: Physician Assistant

## 2016-04-08 ENCOUNTER — Ambulatory Visit (INDEPENDENT_AMBULATORY_CARE_PROVIDER_SITE_OTHER): Payer: PRIVATE HEALTH INSURANCE | Admitting: Physician Assistant

## 2016-04-08 VITALS — BP 133/64 | HR 70 | Ht 64.0 in | Wt 147.0 lb

## 2016-04-08 DIAGNOSIS — R0789 Other chest pain: Secondary | ICD-10-CM

## 2016-04-08 DIAGNOSIS — Z13220 Encounter for screening for lipoid disorders: Secondary | ICD-10-CM | POA: Diagnosis not present

## 2016-04-08 DIAGNOSIS — Z0000 Encounter for general adult medical examination without abnormal findings: Secondary | ICD-10-CM

## 2016-04-08 DIAGNOSIS — D509 Iron deficiency anemia, unspecified: Secondary | ICD-10-CM

## 2016-04-08 DIAGNOSIS — Z131 Encounter for screening for diabetes mellitus: Secondary | ICD-10-CM

## 2016-04-08 NOTE — Patient Instructions (Signed)

## 2016-04-08 NOTE — Progress Notes (Signed)
Subjective:     Teresa Taylor is a 55 y.o. female and is here for a comprehensive physical exam. The patient reports problems - Patient is having some episodes of chest tightness that radiates into her left arm. Usually last for a few minutes and as long as 30 minutes. It then resolves. She is concerned that it could be her heart. She does notice that stress and anxiety seem to trigger it. She denies any shortness of breath or cough. Seems to be happening more frequently over the last 2-3 months..  Social History   Social History  . Marital status: Divorced    Spouse name: N/A  . Number of children: N/A  . Years of education: N/A   Occupational History  . Not on file.   Social History Main Topics  . Smoking status: Never Smoker  . Smokeless tobacco: Not on file  . Alcohol use Not on file  . Drug use: Unknown  . Sexual activity: Not on file   Other Topics Concern  . Not on file   Social History Narrative  . No narrative on file   Health Maintenance  Topic Date Due  . PAP SMEAR  01/04/2016  . MAMMOGRAM  01/18/2016  . INFLUENZA VACCINE  09/17/2016 (Originally 04/05/2016)  . COLONOSCOPY  09/20/2016 (Originally 12/07/2011)  . Hepatitis C Screening  09/20/2016 (Originally [birth year])  . HIV Screening  09/20/2016 (Originally 12/06/1976)  . TETANUS/TDAP  12/07/2021    The following portions of the patient's history were reviewed and updated as appropriate: allergies, current medications, past family history, past medical history, past social history, past surgical history and problem list.  Review of Systems Pertinent items noted in HPI and remainder of comprehensive ROS otherwise negative.   Objective:    BP 133/64   Pulse 70   Ht 5\' 4"  (1.626 m)   Wt 147 lb (66.7 kg)   BMI 25.23 kg/m  General appearance: alert, cooperative and appears stated age Head: Normocephalic, without obvious abnormality, atraumatic Eyes: conjunctivae/corneas clear. PERRL, EOM's intact. Fundi  benign. Ears: normal TM's and external ear canals both ears Nose: Nares normal. Septum midline. Mucosa normal. No drainage or sinus tenderness. Throat: lips, mucosa, and tongue normal; teeth and gums normal Neck: no adenopathy, no carotid bruit, no JVD, supple, symmetrical, trachea midline and thyroid not enlarged, symmetric, no tenderness/mass/nodules Back: symmetric, no curvature. ROM normal. No CVA tenderness. Lungs: clear to auscultation bilaterally Heart: regular rate and rhythm, S1, S2 normal, no murmur, click, rub or gallop Abdomen: soft, non-tender; bowel sounds normal; no masses,  no organomegaly Extremities: extremities normal, atraumatic, no cyanosis or edema Pulses: 2+ and symmetric Skin: Skin color, texture, turgor normal. No rashes or lesions Lymph nodes: Cervical, supraclavicular, and axillary nodes normal. Neurologic: Alert and oriented X 3, normal strength and tone. Normal symmetric reflexes. Normal coordination and gait    Assessment:    Healthy female exam.      Plan:    CPE- [facility] and [facility]. Lipid, cmp, cbc, ferritin. Vaccines up to date. Recommended ASA, vitamin D 800 units and calcium 1500mg .  Atypical chest pain- EKG NSR, no ST elevation or depression, no arrhthymias. Likely due to stress and anxiety. Discussed relaxation techniques when feels this pain.   See After Visit Summary for Counseling Recommendations

## 2016-04-09 LAB — COMPLETE METABOLIC PANEL WITH GFR
ALK PHOS: 68 U/L (ref 33–130)
ALT: 9 U/L (ref 6–29)
AST: 14 U/L (ref 10–35)
Albumin: 4.1 g/dL (ref 3.6–5.1)
BUN: 13 mg/dL (ref 7–25)
CO2: 22 mmol/L (ref 20–31)
Calcium: 8.9 mg/dL (ref 8.6–10.4)
Chloride: 104 mmol/L (ref 98–110)
Creat: 0.92 mg/dL (ref 0.50–1.05)
GFR, EST NON AFRICAN AMERICAN: 71 mL/min (ref >=60)
GFR, Est African American: 82 mL/min (ref >=60)
GLUCOSE: 78 mg/dL (ref 65–99)
POTASSIUM: 3.8 mmol/L (ref 3.5–5.3)
SODIUM: 139 mmol/L (ref 135–146)
Total Bilirubin: 0.6 mg/dL (ref 0.2–1.2)
Total Protein: 7.1 g/dL (ref 6.1–8.1)

## 2016-04-09 LAB — LIPID PANEL
CHOL/HDL RATIO: 2.8 ratio (ref <=5.0)
Cholesterol: 155 mg/dL (ref 125–200)
HDL: 56 mg/dL (ref >=46)
LDL CALC: 82 mg/dL (ref <130)
Triglycerides: 84 mg/dL (ref <150)
VLDL: 17 mg/dL (ref <30)

## 2016-04-09 LAB — CBC WITH DIFFERENTIAL/PLATELET
BASOS ABS: 114 {cells}/uL (ref 0–200)
Basophils Relative: 1 %
EOS PCT: 5 %
Eosinophils Absolute: 570 cells/uL — ABNORMAL HIGH (ref 15–500)
HEMATOCRIT: 35.2 % (ref 35.0–45.0)
HEMOGLOBIN: 10.9 g/dL — AB (ref 11.7–15.5)
LYMPHS ABS: 3306 {cells}/uL (ref 850–3900)
LYMPHS PCT: 29 %
MCH: 21.2 pg — AB (ref 27.0–33.0)
MCHC: 31 g/dL — AB (ref 32.0–36.0)
MCV: 68.3 fL — ABNORMAL LOW (ref 80.0–100.0)
Monocytes Absolute: 570 cells/uL (ref 200–950)
Monocytes Relative: 5 %
NEUTROS PCT: 60 %
Neutro Abs: 6840 cells/uL (ref 1500–7800)
Platelets: 244 10*3/uL (ref 140–400)
RBC: 5.15 MIL/uL — AB (ref 3.80–5.10)
RDW: 16.4 % — AB (ref 11.0–15.0)
WBC: 11.4 10*3/uL — AB (ref 3.8–10.8)

## 2016-04-09 LAB — HEMOGLOBIN A1C
Hgb A1c MFr Bld: 5.3 % (ref <5.7)
Mean Plasma Glucose: 105 mg/dL

## 2016-04-09 LAB — FERRITIN: FERRITIN: 56 ng/mL (ref 10–232)

## 2016-04-10 ENCOUNTER — Encounter: Payer: Self-pay | Admitting: Physician Assistant

## 2016-04-10 DIAGNOSIS — D509 Iron deficiency anemia, unspecified: Secondary | ICD-10-CM | POA: Insufficient documentation

## 2016-04-11 LAB — IRON AND TIBC
%SAT: 26 % (ref 11–50)
IRON: 84 ug/dL (ref 45–160)
TIBC: 319 ug/dL (ref 250–450)
UIBC: 235 ug/dL (ref 125–400)

## 2016-04-13 ENCOUNTER — Encounter: Payer: Self-pay | Admitting: Physician Assistant

## 2017-02-17 LAB — HM PAP SMEAR: HM PAP: NEGATIVE

## 2017-04-24 ENCOUNTER — Telehealth: Payer: Self-pay

## 2017-04-24 NOTE — Telephone Encounter (Signed)
Error

## 2017-05-04 ENCOUNTER — Telehealth: Payer: Self-pay | Admitting: Physician Assistant

## 2017-05-04 NOTE — Telephone Encounter (Signed)
Patient called has a physical scheduled for 05/05/17 at 10:30 and she would like to have the basic physical lab order sent down so she can go between today or tomorrow morning. Thanks

## 2017-05-05 ENCOUNTER — Other Ambulatory Visit: Payer: Self-pay | Admitting: Physician Assistant

## 2017-05-05 ENCOUNTER — Other Ambulatory Visit: Payer: Self-pay

## 2017-05-05 ENCOUNTER — Ambulatory Visit (INDEPENDENT_AMBULATORY_CARE_PROVIDER_SITE_OTHER): Payer: PRIVATE HEALTH INSURANCE | Admitting: Physician Assistant

## 2017-05-05 VITALS — BP 133/75 | HR 73 | Ht 64.0 in | Wt 153.0 lb

## 2017-05-05 DIAGNOSIS — Z1211 Encounter for screening for malignant neoplasm of colon: Secondary | ICD-10-CM

## 2017-05-05 DIAGNOSIS — D509 Iron deficiency anemia, unspecified: Secondary | ICD-10-CM

## 2017-05-05 DIAGNOSIS — Z0000 Encounter for general adult medical examination without abnormal findings: Secondary | ICD-10-CM

## 2017-05-05 DIAGNOSIS — E663 Overweight: Secondary | ICD-10-CM

## 2017-05-05 LAB — CBC WITH DIFFERENTIAL/PLATELET
BASOS ABS: 86 {cells}/uL (ref 0–200)
Basophils Relative: 1 %
EOS PCT: 10 %
Eosinophils Absolute: 860 cells/uL — ABNORMAL HIGH (ref 15–500)
HCT: 34.8 % — ABNORMAL LOW (ref 35.0–45.0)
Hemoglobin: 10.9 g/dL — ABNORMAL LOW (ref 11.7–15.5)
Lymphocytes Relative: 25 %
Lymphs Abs: 2150 cells/uL (ref 850–3900)
MCH: 21.3 pg — AB (ref 27.0–33.0)
MCHC: 31.3 g/dL — AB (ref 32.0–36.0)
MCV: 68.1 fL — AB (ref 80.0–100.0)
MONOS PCT: 6 %
Monocytes Absolute: 516 cells/uL (ref 200–950)
Neutro Abs: 4988 cells/uL (ref 1500–7800)
Neutrophils Relative %: 58 %
Platelets: 234 10*3/uL (ref 140–400)
RBC: 5.11 MIL/uL — ABNORMAL HIGH (ref 3.80–5.10)
RDW: 15.9 % — AB (ref 11.0–15.0)
WBC: 8.6 10*3/uL (ref 3.8–10.8)

## 2017-05-05 NOTE — Patient Instructions (Signed)

## 2017-05-05 NOTE — Progress Notes (Signed)
Subjective:     Teresa Taylor is a 56 y.o. female and is here for a comprehensive physical exam. The patient reports problems - see below.    She continues to struggle with no being able to lose weight. 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 She doesn't have a lot of energy. She feels like if she had more energy then she would be able to exercise more and lose weight. She continues to have periods.   She is also concerned with the fact that pain medication has never worked with her. She has had dental procedures/wisdom teeth removed and the "caine" they used did not numb her.she also had spinal attempted to have a C section but did not work. Ibuprofen does help with cramps and low level discomfort. Previous PCP looked into this but never came up with anything.    Social History   Social History  . Marital status: Divorced    Spouse name: N/A  . Number of children: N/A  . Years of education: N/A   Occupational History  . Not on file.   Social History Main Topics  . Smoking status: Never Smoker  . Smokeless tobacco: Not on file  . Alcohol use Not on file  . Drug use: Unknown  . Sexual activity: Not on file   Other Topics Concern  . Not on file   Social History Narrative  . No narrative on file   Health Maintenance  Topic Date Due  . Hepatitis C Screening  [birth year]  . HIV Screening  12/06/1976  . COLONOSCOPY  12/07/2011  . PAP SMEAR  01/04/2016  . MAMMOGRAM  01/18/2016  . INFLUENZA VACCINE  04/05/2017  . TETANUS/TDAP  12/07/2021    The following portions of the patient's history were reviewed and updated as appropriate: allergies, current medications, past family history, past medical history, past social history, past surgical history and problem list.  Review of Systems Pertinent items noted in HPI and remainder of comprehensive ROS otherwise negative.   Objective:    BP 133/75   Pulse 73   Ht 5\' 4"  (1.626 m)   Wt 153 lb (69.4 kg)   BMI 26.26  kg/m  General appearance: alert, cooperative and appears stated age Head: Normocephalic, without obvious abnormality, atraumatic Eyes: conjunctivae/corneas clear. PERRL, EOM's intact. Fundi benign. Ears: normal TM's and external ear canals both ears Nose: Nares normal. Septum midline. Mucosa normal. No drainage or sinus tenderness. Throat: lips, mucosa, and tongue normal; teeth and gums normal Neck: no adenopathy, no carotid bruit, no JVD, supple, symmetrical, trachea midline and thyroid not enlarged, symmetric, no tenderness/mass/nodules Back: symmetric, no curvature. ROM normal. No CVA tenderness. Lungs: clear to auscultation bilaterally Heart: regular rate and rhythm, S1, S2 normal, no murmur, click, rub or gallop Abdomen: soft, non-tender; bowel sounds normal; no masses,  no organomegaly Extremities: extremities normal, atraumatic, no cyanosis or edema Pulses: 2+ and symmetric Skin: Skin color, texture, turgor normal. No rashes or lesions Lymph nodes: Cervical, supraclavicular, and axillary nodes normal. Neurologic: Grossly normal    Assessment:    Healthy female exam.      Plan:  Marland Kitchen.Marland Kitchen.Teresa Taylor was seen today for annual exam.  Diagnoses and all orders for this visit:  Routine physical examination  Overweight  Iron deficiency anemia, unspecified iron deficiency anemia type  .Marland Kitchen. Depression screen PHQ 2/9 05/05/2017  Decreased Interest 0  Down, Depressed, Hopeless 0  PHQ - 2 Score 0   Mammogram and pap up to date  per patient.  Will order colonoscopy.   Will look into DNA testing for pain metabolism and then consider consult with anesthesia.   Discussed fasting labs today.   Marland Kitchen.Discussed low carb diet with 1500 calories and 80g of protein.  Exercising at least 150 minutes a week.  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  .. Discussed 150 minutes of exercise a week.  Encouraged vitamin D 1000 units and Calcium 1300mg  or 4 servings of dairy a day.   Will add b12/vitamin D  to look at reasons for no energy. Pt has hx of IDA. She continues to bleed. Increase iron rich foods.     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  See After Visit Summary for Counseling Recommendations

## 2017-05-06 LAB — LIPID PANEL
Cholesterol: 184 mg/dL (ref <200)
HDL: 56 mg/dL (ref >50)
LDL CALC: 108 mg/dL — AB (ref <100)
Total CHOL/HDL Ratio: 3.3 Ratio (ref <5.0)
Triglycerides: 98 mg/dL (ref <150)
VLDL: 20 mg/dL (ref <30)

## 2017-05-06 LAB — COMPREHENSIVE METABOLIC PANEL
ALBUMIN: 4.1 g/dL (ref 3.6–5.1)
ALT: 13 U/L (ref 6–29)
AST: 18 U/L (ref 10–35)
Alkaline Phosphatase: 83 U/L (ref 33–130)
BILIRUBIN TOTAL: 0.5 mg/dL (ref 0.2–1.2)
BUN: 17 mg/dL (ref 7–25)
CO2: 21 mmol/L (ref 20–32)
CREATININE: 1.01 mg/dL (ref 0.50–1.05)
Calcium: 8.9 mg/dL (ref 8.6–10.4)
Chloride: 107 mmol/L (ref 98–110)
Glucose, Bld: 81 mg/dL (ref 65–99)
Potassium: 4.3 mmol/L (ref 3.5–5.3)
SODIUM: 140 mmol/L (ref 135–146)
TOTAL PROTEIN: 6.8 g/dL (ref 6.1–8.1)

## 2017-05-06 LAB — HEMOGLOBIN A1C
HEMOGLOBIN A1C: 4.9 % (ref <5.7)
Mean Plasma Glucose: 94 mg/dL

## 2017-05-06 LAB — TSH: TSH: 2.3 m[IU]/L

## 2017-05-09 ENCOUNTER — Telehealth: Payer: Self-pay | Admitting: Physician Assistant

## 2017-05-09 DIAGNOSIS — E663 Overweight: Secondary | ICD-10-CM | POA: Insufficient documentation

## 2017-05-09 NOTE — Progress Notes (Signed)
Call Pt: a1c looks fabulous. Better than 1 year ago.  Cholesterol up a little but still looks good.  Kidney, liver, glucose looks good.  Hgb low but stable do you take any iron?    Can we see if we can add b12 and vitamin D to labs?

## 2017-05-10 ENCOUNTER — Encounter: Payer: Self-pay | Admitting: Physician Assistant

## 2017-05-10 NOTE — Progress Notes (Signed)
Call pt: come in for nurse visit to do [facility] laboratory testing. It is a DNA swab.   Also referral was made for colonoscopy but you did not want this right? You are ok with cologuard sampling right? If so can we get that faxed to have kit shipped to her?

## 2017-05-10 NOTE — Telephone Encounter (Signed)
I did not write down where patient goes but can we call and find out where gets mammogram and pap so that we can get entered into EMR.

## 2017-05-11 LAB — VITAMIN D 25 HYDROXY (VIT D DEFICIENCY, FRACTURES): VIT D 25 HYDROXY: 25 ng/mL — AB (ref 30–100)

## 2017-05-11 LAB — VITAMIN B12: Vitamin B-12: 379 pg/mL (ref 200–1100)

## 2017-05-11 NOTE — Telephone Encounter (Signed)
Already faxed request to [facility]

## 2017-05-12 NOTE — Progress Notes (Signed)
B12 great.  Vitamin D has dropped from 2 years ago. D3 2000 units daily could help get this up.

## 2017-05-15 ENCOUNTER — Telehealth: Payer: Self-pay | Admitting: Physician Assistant

## 2017-05-15 NOTE — Telephone Encounter (Signed)
Pt has been advised we do not know the cost and she will need to research this.

## 2017-05-15 NOTE — Telephone Encounter (Signed)
Teresa Taylor Pt called changed nurse visit to Monday 05/22/17 but request to know how much cost will be. Patient called the number you gave her and it did not ring gave a weird sound and she went on the web site you advised and it said to come. Her Insurance company did tell pt that they are out of network and she will be responsible out of pocket which could be up to 12,000. Please adv

## 2017-05-17 ENCOUNTER — Ambulatory Visit: Payer: PRIVATE HEALTH INSURANCE

## 2017-05-18 ENCOUNTER — Telehealth: Payer: Self-pay | Admitting: Physician Assistant

## 2017-05-18 ENCOUNTER — Encounter: Payer: Self-pay | Admitting: Physician Assistant

## 2017-05-22 ENCOUNTER — Ambulatory Visit: Payer: PRIVATE HEALTH INSURANCE

## 2017-05-23 NOTE — Telephone Encounter (Signed)
none

## 2017-05-31 ENCOUNTER — Telehealth: Payer: Self-pay | Admitting: Physician Assistant

## 2017-05-31 NOTE — Telephone Encounter (Signed)
Pt called clinic stating she broke a tooth and is going to have to have a procedure done to fix it. None of the local numbing agents work for her, so the dentist wanted to see what the PCP thought about the effects of valium. Or if PCP has any other idea of what the Pt could take. Routing.

## 2017-05-31 NOTE — Telephone Encounter (Signed)
Pt advised. She will get her dentist to write the Rx. I advised if the dentist cant write it to contact our office.

## 2017-05-31 NOTE — Telephone Encounter (Signed)
We had discussed valium at last visit. I am totally ok with trying 30 minutes before procedure.

## 2017-06-01 ENCOUNTER — Encounter: Payer: Self-pay | Admitting: Physician Assistant

## 2017-06-06 ENCOUNTER — Telehealth: Payer: Self-pay

## 2017-06-06 NOTE — Telephone Encounter (Signed)
[employer] stated they will send a rep.   Rep Laureen Abrahams - [phone]

## 2017-06-06 NOTE — Telephone Encounter (Signed)
Awesome. Will they contact patient. Let patient know.

## 2017-06-06 NOTE — Telephone Encounter (Signed)
No the rep with reach out to [place] for more information. He will also come by with the kits.

## 2017-06-06 NOTE — Telephone Encounter (Signed)
Please help me remember these 2 patient's. Thanks.

## 2017-06-06 NOTE — Telephone Encounter (Signed)
Great. Can we keep a list of these 2 patients so that we remember when he gets here.

## 2018-05-18 ENCOUNTER — Encounter: Payer: Self-pay | Admitting: Physician Assistant

## 2018-05-18 ENCOUNTER — Ambulatory Visit (INDEPENDENT_AMBULATORY_CARE_PROVIDER_SITE_OTHER): Payer: PRIVATE HEALTH INSURANCE | Admitting: Physician Assistant

## 2018-05-18 VITALS — BP 123/66 | HR 64 | Ht 64.0 in | Wt 155.0 lb

## 2018-05-18 DIAGNOSIS — F900 Attention-deficit hyperactivity disorder, predominantly inattentive type: Secondary | ICD-10-CM

## 2018-05-18 DIAGNOSIS — D509 Iron deficiency anemia, unspecified: Secondary | ICD-10-CM

## 2018-05-18 DIAGNOSIS — Z0000 Encounter for general adult medical examination without abnormal findings: Secondary | ICD-10-CM | POA: Diagnosis not present

## 2018-05-18 DIAGNOSIS — G4719 Other hypersomnia: Secondary | ICD-10-CM | POA: Diagnosis not present

## 2018-05-18 DIAGNOSIS — F411 Generalized anxiety disorder: Secondary | ICD-10-CM

## 2018-05-18 DIAGNOSIS — F419 Anxiety disorder, unspecified: Secondary | ICD-10-CM

## 2018-05-18 DIAGNOSIS — R5383 Other fatigue: Secondary | ICD-10-CM

## 2018-05-18 LAB — POCT HEMOGLOBIN: Hemoglobin: 10.2 g/dL — AB (ref 12.2–16.2)

## 2018-05-18 MED ORDER — CYANOCOBALAMIN 1000 MCG/ML IJ SOLN
1000.0000 ug | Freq: Once | INTRAMUSCULAR | Status: AC
Start: 2018-05-18 — End: 2018-05-18
  Administered 2018-05-18: 1000 ug via INTRAMUSCULAR

## 2018-05-18 MED ORDER — DIAZEPAM 5 MG PO TABS: ORAL_TABLET | ORAL | 0 refills | Status: DC

## 2018-05-18 MED ORDER — METHYLPHENIDATE HCL ER (OSM) 36 MG PO TBCR: 36.0000 mg | EXTENDED_RELEASE_TABLET | Freq: Every day | ORAL | 0 refills | Status: DC

## 2018-05-18 NOTE — Patient Instructions (Addendum)
Restart concerta.  Start ferrous sulfate at least once a day but could do twice a day.  Gave b12 today. See how you do over the month.  Cologuard.    Health Maintenance for Postmenopausal Women Menopause is a normal process in which your reproductive ability comes to an end. This process happens gradually over a span of months to years, usually between the ages of 56 and 64. Menopause is complete when you have missed 12 consecutive menstrual periods. It is important to talk with your health care provider about some of the most common conditions that affect postmenopausal women, such as heart disease, cancer, and bone loss (osteoporosis). Adopting a healthy lifestyle and getting preventive care can help to promote your health and wellness. Those actions can also lower your chances of developing some of these common conditions. What should I know about menopause? During menopause, you may experience a number of symptoms, such as:  Moderate-to-severe hot flashes.  Night sweats.  Decrease in sex drive.  Mood swings.  Headaches.  Tiredness.  Irritability.  Memory problems.  Insomnia.  Choosing to treat or not to treat menopausal changes is an individual decision that you make with your health care provider. What should I know about hormone replacement therapy and supplements? Hormone therapy products are effective for treating symptoms that are associated with menopause, such as hot flashes and night sweats. Hormone replacement carries certain risks, especially as you become older. If you are thinking about using estrogen or estrogen with progestin treatments, discuss the benefits and risks with your health care provider. What should I know about heart disease and stroke? Heart disease, heart attack, and stroke become more likely as you age. This may be due, in part, to the hormonal changes that your body experiences during menopause. These can affect how your body processes dietary  fats, triglycerides, and cholesterol. Heart attack and stroke are both medical emergencies. There are many things that you can do to help prevent heart disease and stroke:  Have your blood pressure checked at least every 1-2 years. High blood pressure causes heart disease and increases the risk of stroke.  If you are 56-61 years old, ask your health care provider if you should take aspirin to prevent a heart attack or a stroke.  Do not use any tobacco products, including cigarettes, chewing tobacco, or electronic cigarettes. If you need help quitting, ask your health care provider.  It is important to eat a healthy diet and maintain a healthy weight. ? Be sure to include plenty of vegetables, fruits, low-fat dairy products, and lean protein. ? Avoid eating foods that are high in solid fats, added sugars, or salt (sodium).  Get regular exercise. This is one of the most important things that you can do for your health. ? Try to exercise for at least 150 minutes each week. The type of exercise that you do should increase your heart rate and make you sweat. This is known as moderate-intensity exercise. ? Try to do strengthening exercises at least twice each week. Do these in addition to the moderate-intensity exercise.  Know your numbers.Ask your health care provider to check your cholesterol and your blood glucose. Continue to have your blood tested as directed by your health care provider.  What should I know about cancer screening? There are several types of cancer. Take the following steps to reduce your risk and to catch any cancer development as early as possible. Breast Cancer  Practice breast self-awareness. ? This means understanding  how your breasts normally appear and feel. ? It also means doing regular breast self-exams. Let your health care provider know about any changes, no matter how small.  If you are 56 or older, have a clinician do a breast exam (clinical breast exam or  CBE) every year. Depending on your age, family history, and medical history, it may be recommended that you also have a yearly breast X-ray (mammogram).  If you have a family history of breast cancer, talk with your health care provider about genetic screening.  If you are at high risk for breast cancer, talk with your health care provider about having an MRI and a mammogram every year.  Breast cancer (BRCA) gene test is recommended for women who have family members with BRCA-related cancers. Results of the assessment will determine the need for genetic counseling and BRCA1 and for BRCA2 testing. BRCA-related cancers include these types: ? Breast. This occurs in males or females. ? Ovarian. ? Tubal. This may also be called fallopian tube cancer. ? Cancer of the abdominal or pelvic lining (peritoneal cancer). ? Prostate. ? Pancreatic.  Cervical, Uterine, and Ovarian Cancer Your health care provider may recommend that you be screened regularly for cancer of the pelvic organs. These include your ovaries, uterus, and vagina. This screening involves a pelvic exam, which includes checking for microscopic changes to the surface of your cervix (Pap test).  For women ages 21-65, health care providers may recommend a pelvic exam and a Pap test every three years. For women ages 55-65, they may recommend the Pap test and pelvic exam, combined with testing for human papilloma virus (HPV), every five years. Some types of HPV increase your risk of cervical cancer. Testing for HPV may also be done on women of any age who have unclear Pap test results.  Other health care providers may not recommend any screening for nonpregnant women who are considered low risk for pelvic cancer and have no symptoms. Ask your health care provider if a screening pelvic exam is right for you.  If you have had past treatment for cervical cancer or a condition that could lead to cancer, you need Pap tests and screening for cancer  for at least 20 years after your treatment. If Pap tests have been discontinued for you, your risk factors (such as having a new sexual partner) need to be reassessed to determine if you should start having screenings again. Some women have medical problems that increase the chance of getting cervical cancer. In these cases, your health care provider may recommend that you have screening and Pap tests more often.  If you have a family history of uterine cancer or ovarian cancer, talk with your health care provider about genetic screening.  If you have vaginal bleeding after reaching menopause, tell your health care provider.  There are currently no reliable tests available to screen for ovarian cancer.  Lung Cancer Lung cancer screening is recommended for adults 17-8 years old who are at high risk for lung cancer because of a history of smoking. A yearly low-dose CT scan of the lungs is recommended if you:  Currently smoke.  Have a history of at least 30 pack-years of smoking and you currently smoke or have quit within the past 15 years. A pack-year is smoking an average of one pack of cigarettes per day for one year.  Yearly screening should:  Continue until it has been 15 years since you quit.  Stop if you develop a health problem  that would prevent you from having lung cancer treatment.  Colorectal Cancer  This type of cancer can be detected and can often be prevented.  Routine colorectal cancer screening usually begins at age 58 and continues through age 62.  If you have risk factors for colon cancer, your health care provider may recommend that you be screened at an earlier age.  If you have a family history of colorectal cancer, talk with your health care provider about genetic screening.  Your health care provider may also recommend using home test kits to check for hidden blood in your stool.  A small camera at the end of a tube can be used to examine your colon directly  (sigmoidoscopy or colonoscopy). This is done to check for the earliest forms of colorectal cancer.  Direct examination of the colon should be repeated every 5-10 years until age 19. However, if early forms of precancerous polyps or small growths are found or if you have a family history or genetic risk for colorectal cancer, you may need to be screened more often.  Skin Cancer  Check your skin from head to toe regularly.  Monitor any moles. Be sure to tell your health care provider: ? About any new moles or changes in moles, especially if there is a change in a mole's shape or color. ? If you have a mole that is larger than the size of a pencil eraser.  If any of your family members has a history of skin cancer, especially at a young age, talk with your health care provider about genetic screening.  Always use sunscreen. Apply sunscreen liberally and repeatedly throughout the day.  Whenever you are outside, protect yourself by wearing long sleeves, pants, a wide-brimmed hat, and sunglasses.  What should I know about osteoporosis? Osteoporosis is a condition in which bone destruction happens more quickly than new bone creation. After menopause, you may be at an increased risk for osteoporosis. To help prevent osteoporosis or the bone fractures that can happen because of osteoporosis, the following is recommended:  If you are 60-42 years old, get at least 1,000 mg of calcium and at least 600 mg of vitamin D per day.  If you are older than age 72 but younger than age 25, get at least 1,200 mg of calcium and at least 600 mg of vitamin D per day.  If you are older than age 24, get at least 1,200 mg of calcium and at least 800 mg of vitamin D per day.  Smoking and excessive alcohol intake increase the risk of osteoporosis. Eat foods that are rich in calcium and vitamin D, and do weight-bearing exercises several times each week as directed by your health care provider. What should I know about  how menopause affects my mental health? Depression may occur at any age, but it is more common as you become older. Common symptoms of depression include:  Low or sad mood.  Changes in sleep patterns.  Changes in appetite or eating patterns.  Feeling an overall lack of motivation or enjoyment of activities that you previously enjoyed.  Frequent crying spells.  Talk with your health care provider if you think that you are experiencing depression. What should I know about immunizations? It is important that you get and maintain your immunizations. These include:  Tetanus, diphtheria, and pertussis (Tdap) booster vaccine.  Influenza every year before the flu season begins.  Pneumonia vaccine.  Shingles vaccine.  Your health care provider may also recommend other  immunizations. This information is not intended to replace advice given to you by your health care provider. Make sure you discuss any questions you have with your health care provider. Document Released: 10/14/2005 Document Revised: 03/11/2016 Document Reviewed: 05/26/2015 Elsevier Interactive Patient Education  2018 [employer].

## 2018-05-18 NOTE — Progress Notes (Signed)
Subjective:     Teresa Taylor is a 57 y.o. female and is here for a comprehensive physical exam. The patient reports problems - see below.    Pt continues to struggle with weight and fatigue. She has gone off hormones. She has stopped OCP pill. She has to force herself to go to the gym. She takes naps at night after work. Denies any depression. She continues to gain. She is not able to lose weight. She is yoga instruction and walks regularly. She denies any fever, chills., cough. She sleeps fairly well at night but her watch does show 23 night time wakens. No snoring.   Has upcoming dental procedure. VERY scare due to her intolerance to pain medication. She needs something to calm her down before procedures.      Social History   Socioeconomic History  . Marital status: Divorced    Spouse name: Not on file  . Number of children: Not on file  . Years of education: Not on file  . Highest education level: Not on file  Occupational History  . Not on file  Social Needs  . Financial resource strain: Not on file  . Food insecurity:    Worry: Not on file    Inability: Not on file  . Transportation needs:    Medical: Not on file    Non-medical: Not on file  Tobacco Use  . Smoking status: Never Smoker  . Smokeless tobacco: Never Used  Substance and Sexual Activity  . Alcohol use: Not on file  . Drug use: Not on file  . Sexual activity: Not on file  Lifestyle  . Physical activity:    Days per week: Not on file    Minutes per session: Not on file  . Stress: Not on file  Relationships  . Social connections:    Talks on phone: Not on file    Gets together: Not on file    Attends religious service: Not on file    Active member of club or organization: Not on file    Attends meetings of clubs or organizations: Not on file    Relationship status: Not on file  . Intimate partner violence:    Fear of current or ex partner: Not on file    Emotionally abused: Not on file    Physically  abused: Not on file    Forced sexual activity: Not on file  Other Topics Concern  . Not on file  Social History Narrative  . Not on file   Health Maintenance  Topic Date Due  . MAMMOGRAM  01/18/2016  . Hepatitis C Screening  05/19/2019 (Originally 11/22/1960)  . COLONOSCOPY  05/21/2019 (Originally 12/07/2011)  . HIV Screening  05/21/2019 (Originally 12/06/1976)  . INFLUENZA VACCINE  05/10/2047 (Originally 04/05/2018)  . PAP SMEAR  02/18/2020  . TETANUS/TDAP  12/07/2021    The following portions of the patient's history were reviewed and updated as appropriate: allergies, current medications, past family history, past medical history, past social history, past surgical history and problem list.  Review of Systems Pertinent items noted in HPI and remainder of comprehensive ROS otherwise negative.   Objective:    BP 123/66   Pulse 64   Ht 5\' 4"  (1.626 m)   Wt 155 lb (70.3 kg)   BMI 26.61 kg/m  General appearance: alert, cooperative and appears stated age Head: Normocephalic, without obvious abnormality, atraumatic Eyes: conjunctivae/corneas clear. PERRL, EOM's intact. Fundi benign. Ears: normal TM's and external ear canals both  ears Nose: Nares normal. Septum midline. Mucosa normal. No drainage or sinus tenderness. Throat: lips, mucosa, and tongue normal; teeth and gums normal Neck: no adenopathy, no carotid bruit, no JVD, supple, symmetrical, trachea midline and thyroid not enlarged, symmetric, no tenderness/mass/nodules Back: symmetric, no curvature. ROM normal. No CVA tenderness. Lungs: clear to auscultation bilaterally Heart: regular rate and rhythm, S1, S2 normal, no murmur, click, rub or gallop Abdomen: soft, non-tender; bowel sounds normal; no masses,  no organomegaly Extremities: extremities normal, atraumatic, no cyanosis or edema Pulses: 2+ and symmetric Skin: Skin color, texture, turgor normal. No rashes or lesions Lymph nodes: Cervical, supraclavicular, and axillary  nodes normal. Neurologic: Alert and oriented X 3, normal strength and tone. Normal symmetric reflexes. Normal coordination and gait    Assessment:    Healthy female exam.      Plan:    Marland KitchenMarland KitchenTammy was seen today for annual exam.  Diagnoses and all orders for this visit:  Routine physical examination  Attention deficit hyperactivity disorder (ADHD), predominantly inattentive type -     methylphenidate (CONCERTA) 36 MG PO CR tablet; Take 1 tablet (36 mg total) by mouth daily.  Low energy -     POCT hemoglobin -     cyanocobalamin ((VITAMIN B-12)) injection 1,000 mcg  Excessive daytime sleepiness  Anxiety due to invasive procedure -     diazepam (VALIUM) 5 MG tablet; Take 1 tab PO 2 hours before procedure or imaging.  Iron deficiency anemia, unspecified iron deficiency anemia type   .Marland Kitchen Depression screen [facility] 2/9 05/18/2018 05/05/2017  Decreased Interest 1 0  Down, Depressed, Hopeless 1 0  PHQ - 2 Score 2 0  Altered sleeping 3 -  Tired, decreased energy 3 -  Change in appetite 0 -  Feeling bad or failure about yourself  0 -  Trouble concentrating 3 -  Moving slowly or fidgety/restless 0 -  Suicidal thoughts 0 -  PHQ-9 Score 11 -  Difficult doing work/chores Extremely dIfficult -   .. GAD 7 : Generalized Anxiety Score 05/18/2018  Nervous, Anxious, on Edge 1  Control/stop worrying 1  Worry too much - different things 1  Trouble relaxing 1  Restless 0  Easily annoyed or irritable 0  Afraid - awful might happen 0  Total GAD 7 Score 4  Anxiety Difficulty Not difficult at all   .Marland Kitchen Results for orders placed or performed in visit on 05/18/18  POCT hemoglobin  Result Value Ref Range   Hemoglobin 10.2 (A) 12.2 - 16.2 g/dL    Discussed 782 minutes of exercise a week.  Encouraged vitamin D 1000 units and Calcium 1300mg  or 4 servings of dairy a day.  Ordered mammogram.  Faxed cologuard off.  Pt sees GYN for pap smear.  b12 shot given due to hx of low levels in the past and  no energy today. Ok one shot then continue with daily recheck in 2 weeks.  Fasting labs ordered.  Declined flu shot.   Marland Kitchen.Discussed low carb diet with 1500 calories and 80g of protein.  Exercising at least 150 minutes a week.  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    Hemoglobin low today. Restart ferrous sulfate and increasing iron rich foods.   No energy-consider sleep study due to patient had 23 night time wakenings. Pt declines and thinks most of her night time awakensin is due to hot flahes. Low iron see above treatment plan. Pt has ADHD but not treated. Restart concerta. Lets see home much  energy she gains. No signs of depression causing fatigue.   Follow up in 1 month.   See After Visit Summary for Counseling Recommendations

## 2018-05-20 ENCOUNTER — Encounter: Payer: Self-pay | Admitting: Physician Assistant

## 2018-05-20 DIAGNOSIS — G4719 Other hypersomnia: Secondary | ICD-10-CM | POA: Insufficient documentation

## 2018-05-20 DIAGNOSIS — R5383 Other fatigue: Secondary | ICD-10-CM | POA: Insufficient documentation

## 2018-05-21 ENCOUNTER — Encounter: Payer: Self-pay | Admitting: Physician Assistant

## 2018-05-31 ENCOUNTER — Encounter: Payer: Self-pay | Admitting: Physician Assistant

## 2018-05-31 MED ORDER — LISDEXAMFETAMINE DIMESYLATE 20 MG PO CAPS: 20.0000 mg | ORAL_CAPSULE | ORAL | 0 refills | Status: DC

## 2018-07-23 ENCOUNTER — Encounter: Payer: Self-pay | Admitting: Physician Assistant

## 2018-07-24 ENCOUNTER — Encounter: Payer: Self-pay | Admitting: Physician Assistant

## 2018-08-01 ENCOUNTER — Encounter: Payer: Self-pay | Admitting: Physician Assistant

## 2018-09-26 ENCOUNTER — Encounter: Payer: Self-pay | Admitting: Physician Assistant

## 2018-09-26 ENCOUNTER — Ambulatory Visit (INDEPENDENT_AMBULATORY_CARE_PROVIDER_SITE_OTHER): Payer: PRIVATE HEALTH INSURANCE | Admitting: Physician Assistant

## 2018-09-26 VITALS — BP 127/77 | HR 84 | Temp 98.0°F | Wt 154.0 lb

## 2018-09-26 DIAGNOSIS — B309 Viral conjunctivitis, unspecified: Secondary | ICD-10-CM

## 2018-09-26 NOTE — Patient Instructions (Addendum)
There is no specific antiviral agent for the treatment of viral conjunctivitis. Some patients derive symptomatic relief from topical antihistamine/decongestants. These are available over-the-counter (Naphcon-A, Ocuhist, generics). These agents treat the symptoms but not the disease, just as "cold remedies" treat the symptoms rather than the cause of a cold. Warm or cool compresses may provide additional symptomatic relief.   Can also use an OTC eye lubricant ointment at bedtime   Eye irritation and discharge may get worse for three to five days before getting better, that symptoms can persist for two to three weeks, and that use of any topical agent for that duration might result in irritation and toxicity, which can itself cause redness and discharge.   Viral Conjunctivitis, Adult  Viral conjunctivitis is an inflammation of the clear membrane that covers the white part of your eye and the inner surface of your eyelid (conjunctiva). The inflammation is caused by a viral infection. The blood vessels in the conjunctiva become inflamed, causing the eye to become red or pink, and often itchy. Viral conjunctivitis can be easily passed from one person to another (is contagious). This condition is often called pink eye. What are the causes? This condition is caused by a virus. A virus is a type of contagious germ. It can be spread by touching objects that have been contaminated with the virus, such as doorknobs or towels. It can also be passed through droplets, such as from coughing or sneezing. What are the signs or symptoms? Symptoms of this condition include:  Eye redness.  Tearing or watery eyes.  Itchy and irritated eyes.  Burning feeling in the eyes.  Clear drainage from the eye.  Swollen eyelids.  A gritty feeling in the eye.  Light sensitivity. This condition often occurs with other symptoms, such as a fever, nausea, or a rash. How is this diagnosed? This condition is diagnosed with  a medical history and physical exam. If you have discharge from your eye, the discharge may be tested to rule out other causes of conjunctivitis. How is this treated? Viral conjunctivitis does not respond to medicines that kill bacteria (antibiotics). Treatment for viral conjunctivitis is directed at stopping a bacterial infection from developing in addition to the viral infection. Treatment also aims to relieve your symptoms, such as itching. This may be done with antihistamine drops or other eye medicines. Rarely, steroid eye drops or antiviral medicines may be prescribed. Follow these instructions at home: Medicines   Take or apply over-the-counter and prescription medicines only as told by your health care provider.  Be very careful to avoid touching the edge of the eyelid with the eye drop bottle or ointment tube when applying medicines to the affected eye. Being careful this way will stop you from spreading the infection to the other eye or to other people. Eye care  Avoid touching or rubbing your eyes.  Apply a warm, wet, clean washcloth to your eye for 10-20 minutes, 3-4 times per day or as told by your health care provider.  If you wear contact lenses, do not wear them until the inflammation is gone and your health care provider says it is safe to wear them again. Ask your health care provider how to sterilize or replace your contact lenses before using them again. Wear glasses until you can resume wearing contacts.  Avoid wearing eye makeup until the inflammation is gone. Throw away any old eye cosmetics that may be contaminated.  Gently wipe away any drainage from your eye with a  warm, wet washcloth or a cotton ball. General instructions  Change or wash your pillowcase every day or as told by your health care provider.  Do not share towels, pillowcases, washcloths, eye makeup, makeup brushes, contact lenses, or glasses. This may spread the infection.  Wash your hands often with  soap and water. Use paper towels to dry your hands. If soap and water are not available, use hand sanitizer.  Try to avoid contact with other people for one week or as told by your health care provider. Contact a health care provider if:  Your symptoms do not improve with treatment or they get worse.  You have increased pain.  Your vision becomes blurry.  You have a fever.  You have facial pain, redness, or swelling.  You have yellow or green drainage coming from your eye.  You have new symptoms. This information is not intended to replace advice given to you by your health care provider. Make sure you discuss any questions you have with your health care provider. Document Released: 11/12/2002 Document Revised: 03/19/2016 Document Reviewed: 03/08/2016 Elsevier Interactive Patient Education  2019 [employer].

## 2018-09-26 NOTE — Progress Notes (Signed)
HPI:                                                                Teresa Taylor is a 58 y.o. female who presents to [facility]: Primary [facility] Medicine today for left eye redness  Conjunctivitis   The current episode started yesterday. The onset was sudden. The problem has been gradually worsening. The problem is mild. The symptoms are relieved by rest (warm compress). Associated symptoms include congestion, ear pain (fullness), rhinorrhea, sore throat, eye discharge, eye pain (burning) and eye redness. Pertinent negatives include no fever, no double vision, no photophobia, no hearing loss and no mouth sores.  Right eye started to sting today. No redness.   Past Medical History:  Diagnosis Date  . Allergy    No past surgical history on file. Social History   Tobacco Use  . Smoking status: Never Smoker  . Smokeless tobacco: Never Used  Substance Use Topics  . Alcohol use: Not on file   family history includes Arthritis in her father; Dementia in her maternal grandmother; Hypertension in her maternal grandmother; Stroke in her maternal grandmother; Supraventricular tachycardia in her mother.    ROS: negative except as noted in the HPI  Medications: Current Outpatient Medications  Medication Sig Dispense Refill  . cholecalciferol (VITAMIN D) 1000 units tablet Take 1,000 Units by mouth daily.    . diazepam (VALIUM) 5 MG tablet Take 1 tab PO 2 hours before procedure or imaging. 10 tablet 0  . lisdexamfetamine (VYVANSE) 20 MG capsule Take 1 capsule (20 mg total) by mouth every morning. 30 capsule 0  . methylphenidate (CONCERTA) 36 MG PO CR tablet Take 1 tablet (36 mg total) by mouth daily. 30 tablet 0   No current facility-administered medications for this visit.    Allergies  Allergen Reactions  . Ativan [Lorazepam]     Strange reaction of amensia       Objective:  BP 127/77   Pulse 84   Temp 98 F (36.7 C) (Oral)   Wt 154 lb (69.9 kg)    SpO2 96%   BMI 26.43 kg/m  Gen:  alert, not ill-appearing, no distress, appropriate for age HEENT: head normocephalic without obvious abnormality, eyelids without swelling or redness bilaterally, left sclera with mild injection, scant amount of dried crust in the medial canthus, right conjunctiva and cornea clear, PEERL, EOMs intact, TM's pearly gray and semi-transparent bilaterally, oropharynx clear, neck supple, no cervical adenopathy Pulm: Normal work of breathing, normal phonation, clear to auscultation bilaterally, no wheezes, rales or rhonchi CV: Normal rate, regular rhythm, s1 and s2 distinct, no murmurs, clicks or rubs  MSK: extremities atraumatic, normal gait and station Skin: intact, no rashes on exposed skin, no jaundice, no cyanosis    No results found for this or any previous visit (from the past 72 hour(s)). No results found.    Assessment and Plan: 58 y.o. female with   .Diagnoses and all orders for this visit:  Viral conjunctivitis   Left eye redness, burning and mild morning crusting with for 2 days with associated URI symptoms and likely spread to the right eye most consistent with viral etiology. No vision changes, foreign body sensation, or contact lens use. Counseled on natural progression and supportive  care for viral conjunctivitis May use OTC antihistamine eyedrop prn and OTC eye lubricant QHS Provided with work note   Patient education and anticipatory guidance given Patient agrees with treatment plan Follow-up as needed if symptoms worsen or fail to improve  Levonne Hubertharley E. Cummings PA-C

## 2019-03-01 ENCOUNTER — Encounter: Payer: Self-pay | Admitting: Physician Assistant

## 2019-03-04 MED ORDER — CYCLOBENZAPRINE HCL 10 MG PO TABS: 10.0000 mg | ORAL_TABLET | Freq: Three times a day (TID) | ORAL | 0 refills | Status: DC | PRN

## 2019-03-04 MED ORDER — LISDEXAMFETAMINE DIMESYLATE 20 MG PO CAPS: 20.0000 mg | ORAL_CAPSULE | ORAL | 0 refills | Status: DC

## 2019-03-06 ENCOUNTER — Encounter: Payer: Self-pay | Admitting: Family Medicine

## 2019-03-06 ENCOUNTER — Ambulatory Visit (INDEPENDENT_AMBULATORY_CARE_PROVIDER_SITE_OTHER): Payer: Worker's Compensation | Admitting: Family Medicine

## 2019-03-06 VITALS — BP 144/88 | HR 88 | Temp 98.0°F | Ht 64.0 in | Wt 154.0 lb

## 2019-03-06 DIAGNOSIS — S39012A Strain of muscle, fascia and tendon of lower back, initial encounter: Secondary | ICD-10-CM

## 2019-03-06 NOTE — Patient Instructions (Signed)
Thank you for coming in today. You should hear about PT.  Attend PT.  Recheck in 4 weeks. If doing well ok to do via telemedicine visit.   I think the Quadratus Lumborum muscle is injured and spasms.    Lumbosacral Strain Lumbosacral strain is an injury that causes pain in the lower back (lumbosacral spine). This injury usually occurs from overstretching the muscles or ligaments along your spine. A strain can affect one or more muscles or cord-like tissues that connect bones to other bones (ligaments). What are the causes? This condition may be caused by:  A hard, direct hit (blow) to the back.  Excessive stretching of the lower back muscles. This may result from: ? A fall. ? Lifting something heavy. ? Repetitive movements such as bending or crouching. What increases the risk? The following factors may increase your risk of getting this condition:  Participating in sports or activities that involve: ? A sudden twist of the back. ? Pushing or pulling motions.  Being overweight or obese.  Having poor strength and flexibility, especially tight hamstrings or weak muscles in the back or abdomen.  Having too much of a curve in the lower back.  Having a pelvis that is tilted forward. What are the signs or symptoms? The main symptom of this condition is pain in the lower back, at the site of the strain. Pain may extend (radiate) down one or both legs. How is this diagnosed? This condition is diagnosed based on:  Your symptoms.  Your medical history.  A physical exam. ? Your health care provider may push on certain areas of your back to determine the source of your pain. ? You may be asked to bend forward, backward, and side to side to assess the severity of your pain and your range of motion.  Imaging tests, such as: ? X-rays. ? MRI.  How is this treated? Treatment for this condition may include:  Putting heat and cold on the affected area.  Medicines to help relieve  pain and relax your muscles (muscle relaxants).  NSAIDs to help reduce swelling and discomfort. When your symptoms improve, it is important to gradually return to your normal routine as soon as possible to reduce pain, avoid stiffness, and avoid loss of muscle strength. Generally, symptoms should improve within 6 weeks of treatment. However, recovery time varies. Follow these instructions at home: Managing pain, stiffness, and swelling   If directed, put ice on the injured area during the first 24 hours after your strain. ? Put ice in a plastic bag. ? Place a towel between your skin and the bag. ? Leave the ice on for 20 minutes, 2-3 times a day.  If directed, put heat on the affected area as often as told by your health care provider. Use the heat source that your health care provider recommends, such as a moist heat pack or a heating pad. ? Place a towel between your skin and the heat source. ? Leave the heat on for 20-30 minutes. ? Remove the heat if your skin turns bright red. This is especially important if you are unable to feel pain, heat, or cold. You may have a greater risk of getting burned. Activity  Rest and return to your normal activities as told by your health care provider. Ask your health care provider what activities are safe for you.  Avoid activities that take a lot of energy for as long as told by your health care provider. General instructions  Take  over-the-counter and prescription medicines only as told by your health care provider.  Donot drive or use heavy machinery while taking prescription pain medicine.  Do not use any products that contain nicotine or tobacco, such as cigarettes and e-cigarettes. If you need help quitting, ask your health care provider.  Keep all follow-up visits as told by your health care provider. This is important. How is this prevented?  Use correct form when playing sports and lifting heavy objects.  Use good posture when  sitting and standing.  Maintain a healthy weight.  Sleep on a mattress with medium firmness to support your back.  Be safe and responsible while being active to avoid falls.  Do at least 150 minutes of moderate-intensity exercise each week, such as brisk walking or water aerobics. Try a form of exercise that takes stress off your back, such as swimming or stationary cycling.  Maintain physical fitness, including: ? Strength. ? Flexibility. ? Cardiovascular fitness. ? Endurance. Contact a health care provider if:  Your back pain does not improve after 6 weeks of treatment.  Your symptoms get worse. Get help right away if:  Your back pain is severe.  You cannot stand or walk.  You have difficulty controlling when you urinate or when you have a bowel movement.  You feel nauseous or you vomit.  Your feet get very cold.  You have numbness, tingling, weakness, or problems using your arms or legs.  You develop any of the following: ? Shortness of breath. ? Dizziness. ? Pain in your legs. ? Weakness in your buttocks or legs. ? Discoloration of the skin on your toes or legs. This information is not intended to replace advice given to you by your health care provider. Make sure you discuss any questions you have with your health care provider. Document Released: 06/01/2005 Document Revised: 12/14/2018 Document Reviewed: 01/24/2016 Elsevier Patient Education  2020 [employer].

## 2019-03-06 NOTE — Progress Notes (Signed)
   Subjective:    I'm seeing this patient as a consultation for:  Donella Stade, PA-C   CC: Back Pain.   HPI: A few weeks ago patient felt a pain in her back with lifting at work.  She took ibuprofen and did some stretching which helped a bit.  However she continues to have significant muscle pain in the right lower back.  Injury occurred on June 11 at work.  She was lifting her standing desk which is heavier than 20 pounds.  She notes her pain is intermittent and occurs sharply with activity.  She denies any radiating pain weakness or numbness fevers or chills.  She denies any bowel bladder dysfunction.  She is not currently taking any medications for it as the pain is so intermittent.  Symptoms are moderate and do impact her quality of life.  She is had to reduce exercise a bit as result.  She continues to be able to work as her job duties are typically more sedentary.    Past medical history, Surgical history, Family history not pertinant except as noted below, Social history, Allergies, and medications have been entered into the medical record, reviewed, and no changes needed.   Review of Systems: No headache, visual changes, nausea, vomiting, diarrhea, constipation, dizziness, abdominal pain, skin rash, fevers, chills, night sweats, weight loss, swollen lymph nodes, body aches, joint swelling, muscle aches, chest pain, shortness of breath, mood changes, visual or auditory hallucinations.   Objective:    Vitals:   03/06/19 1201  BP: (!) 144/88  Pulse: 88    General: Well Developed, well nourished, and in no acute distress.  Neuro/Psych: Alert and oriented x3, extra-ocular muscles intact, able to move all 4 extremities, sensation grossly intact. Skin: Warm and dry, no rashes noted.  Respiratory: Not using accessory muscles, speaking in full sentences, trachea midline.  Cardiovascular: Pulses palpable, no extremity edema. Abdomen: Does not appear distended. MSK: L-spine: Nontender  to spinal midline.  Not particularly tender palpation bilateral paraspinal musculature. Normal lumbar motion.  Lower extremity strength reflexes and sensation are equal and normal throughout. Normal gait. During range of motion patient did experience a sharp stabbing sensation in her right lower back that was brief.   Impression and Recommendations:    Assessment and Plan: 58 y.o. female with right low back pain occurring after an injury at work with lifting.  Symptoms have improved but not fully.  Plan to proceed with trial of physical therapy.  We will check back in 4 weeks.  Precautions reviewed.  Continue work.  Recheck sooner if needed.Marland Kitchen  PDMP not reviewed this encounter. Orders Placed This Encounter  Procedures  . Ambulatory referral to Physical Therapy    Referral Priority:   Routine    Referral Type:   Physical Medicine    Referral Reason:   Specialty Services Required    Requested Specialty:   Physical Therapy   No orders of the defined types were placed in this encounter.   Discussed warning signs or symptoms. Please see discharge instructions. Patient expresses understanding.

## 2019-03-13 ENCOUNTER — Encounter: Payer: Self-pay | Admitting: Rehabilitative and Restorative Service Providers"

## 2019-03-13 ENCOUNTER — Other Ambulatory Visit: Payer: Self-pay

## 2019-03-13 ENCOUNTER — Ambulatory Visit (INDEPENDENT_AMBULATORY_CARE_PROVIDER_SITE_OTHER): Payer: PRIVATE HEALTH INSURANCE | Admitting: Rehabilitative and Restorative Service Providers"

## 2019-03-13 DIAGNOSIS — R29898 Other symptoms and signs involving the musculoskeletal system: Secondary | ICD-10-CM

## 2019-03-13 DIAGNOSIS — M545 Low back pain, unspecified: Secondary | ICD-10-CM

## 2019-03-13 NOTE — Therapy (Signed)
[facility], [place], [address] Phone: [phone]   Fax:  [phone]  Physical Therapy Evaluation  Patient Details  Name: Teresa Taylor MRN: [id number] Date of Birth: 09/19/1960 Referring Provider (PT): Dr Lynne Leader    Encounter Date: 03/13/2019  PT End of Session - 03/13/19 0901    Visit Number  1    Number of Visits  12    Date for PT Re-Evaluation  04/24/19    PT Start Time  0900    PT Stop Time  0956    PT Time Calculation (min)  56 min    Activity Tolerance  Patient tolerated treatment well       Past Medical History:  Diagnosis Date  . Allergy     History reviewed. No pertinent surgical history.  There were no vitals filed for this visit.   Subjective Assessment - 03/13/19 0906    Subjective  Patient reports that she was moving her office, lifting a stand up desk from the floor 02/14/2019, she leaned to the Lt while forward then jerked back to the Rt. she felt a sharp pain in the Rt LB which resolved in about a week but has continued to have sharp pain with certain movements about 2x/dy. she can feel discomfort with leaning to Rt after having been sitting.    Pertinent History  2010 injury to LB with fall at work resolved with PT    Patient Stated Goals  get rid of the pain    Currently in Pain?  No/denies    Pain Location  Back    Pain Orientation  Right;Lower    Pain Descriptors / Indicators  Sharp   catching - last 1-10 sec   Pain Type  Acute pain    Pain Onset  More than a month ago    Pain Frequency  Intermittent    Aggravating Factors   moving in certian directions    Pain Relieving Factors  avoiding movement that hurts         [facility] PT Assessment - 03/13/19 0001      Assessment   Medical Diagnosis  lumbosacral strain    Referring Provider (PT)  Dr Lynne Leader     Onset Date/Surgical Date  02/14/19    Hand Dominance  Right    Next MD Visit  04/03/2019    Prior Therapy   yes       Precautions   Precautions  None      Restrictions   Weight Bearing Restrictions  No      Balance Screen   Has the patient fallen in the past 6 months  No    Has the patient had a decrease in activity level because of a fear of falling?   No    Is the patient reluctant to leave their home because of a fear of falling?   No      Prior Function   Level of Independence  Independent    Vocation  Full time employment    Vocation Requirements  sitting computer work     Leisure  Art gallery manager; walking 3 x/wk; working out in [employer] 1x/wk;  household chores;       Observation/Other Assessments   Focus on Therapeutic Outcomes (FOTO)   37% limitation       Sensation   Additional Comments  WFL's       Posture/Postural Control   Posture Comments  WFL's  AROM   Overall AROM Comments  Rt hip tighter than LT     AROM Assessment Site  --   no pain with lumbar motions    Lumbar Flexion  95%    Lumbar Extension  85%    Lumbar - Right Side Bend  90%    Lumbar - Left Side Bend  90%    Lumbar - Right Rotation  45%    Lumbar - Left Rotation  45%       Flexibility   Hamstrings  tighter Rt than Lt    Quadriceps  WFL's    ITB  WFL's     Piriformis  tight Rt       Palpation   Spinal mobility  tender with spring testing mid lumbar pain with CPA mobs     Palpation comment  muscular tightness Rt lats; QL; lower thoracic/lumbar paraspinals                 Objective measurements completed on examination: See above findings.      [facility] Adult PT Treatment/Exercise - 03/13/19 0001      Self-Care   Self-Care  --   education re- activity and position changes      Lumbar Exercises: Stretches   Double Knee to Chest Stretch  2 reps;30 seconds    Lower Trunk Rotation Limitations  QL stretch crossing left LE over Rt dropping legs to the left 30-40 sec x 2 reps     Piriformis Stretch  Right;2 reps;30 seconds   supine travell    Other Lumbar Stretch Exercise  lat  stretch supine 30 -45 sec x 3 reps       Lumbar Exercises: Quadruped   Madcat/Old Horse  5 reps    Other Quadruped Lumbar Exercises  child's pose x 2 for 30 sec     Other Quadruped Lumbar Exercises  lateral stretch from child's pose to left 30 sec x 2 reps       Shoulder Exercises: Seated   Other Seated Exercises  lateral trunk flexion to Lt to stretch Rt LB/side              PT Education - 03/13/19 0949    Education Details  HEP POC DN TENS    Person(s) Educated  Patient    Methods  Explanation;Demonstration;Tactile cues;Verbal cues;Handout    Comprehension  Verbalized understanding;Returned demonstration;Verbal cues required;Tactile cues required          PT Long Term Goals - 03/13/19 1003      PT LONG TERM GOAL #1   Title  Decrease pain with patient to report 0-1 incidents of back pain during the course of her day 04/24/2019    Time  6    Period  Weeks    Status  New      PT LONG TERM GOAL #2   Title  Decresae palpable tightness Rt lumbar musculature to WNLs 04/24/2019    Time  6    Period  Weeks    Status  New      PT LONG TERM GOAL #3   Title  Independent in HEP 04/24/2019    Time  6    Period  Weeks    Status  New      PT LONG TERM GOAL #4   Title  Improve FOTO to </+ 30% limitation 04/24/2019    Time  6    Period  Weeks    Status  New  Plan - 03/13/19 0958    Clinical Impression Statement  Patient presents s/p lifting injury at work 02/14/2019 when she was lifting and jerked back and to the Rt experiencing a sharp pain in the Rt LB. Pain improved over the next week with stretching and yoga at home but has not fully resolved. Patient continues to experience sharp pain in the Rt LB area when she moves in certain directions - extension and Rt rotation. she will feel a sharp pain lasting no more than 10 sec and occuring about 2x/day. Patient has tightness to palaption through the Rt thoracolumbar paraspinals; lats; QL. She will benefit from PT  to address problems identified.    Stability/Clinical Decision Making  Stable/Uncomplicated    Clinical Decision Making  Low    Rehab Potential  Good    PT Frequency  2x / week    PT Duration  6 weeks    PT Treatment/Interventions  Patient/family education;ADLs/Self Care Home Management;Cryotherapy;Electrical Stimulation;Iontophoresis 4mg /ml Dexamethasone;Moist Heat;Ultrasound;Manual techniques;Neuromuscular re-education;Dry needling;Therapeutic activities;Therapeutic exercise    PT Next Visit Plan  review HEP; assess continued pain and tightness; consider trial of DN; manual work and modalities as indicated    Consulted and Agree with Plan of Care  Patient       Patient will benefit from skilled therapeutic intervention in order to improve the following deficits and impairments:  Pain, Increased fascial restricitons, Increased muscle spasms, Decreased mobility, Decreased activity tolerance  Visit Diagnosis: 1. Acute right-sided low back pain without sciatica   2. Other symptoms and signs involving the musculoskeletal system        Problem List Patient Active Problem List   Diagnosis Date Noted  . Low energy 05/20/2018  . Excessive daytime sleepiness 05/20/2018  . Overweight 05/09/2017  . Microcytic anemia 04/10/2016  . Atypical chest pain 04/08/2016  . IDA (iron deficiency anemia) 04/08/2016  . Depression 10/27/2015  . Generalized anxiety disorder 10/26/2015  . Abnormal weight gain 09/18/2015  . Muscle spasm of back 02/21/2014  . Right low back pain 02/21/2014  . ADHD (attention deficit hyperactivity disorder) 01/09/2013  . DOMESTIC ABUSE 02/06/2009  . INSOMNIA 02/15/2008  . TACHYCARDIA, PAROXYSMAL NOS 03/01/2007    Celyn Rober MinionP Holt PT, MPH  03/13/2019, 12:03 PM  [facility] [address] [place], [place], [address] Phone: [phone]   Fax:  [phone]  Name: Teresa Medinammy Taylor MRN: [id number] Date of Birth:  05/23/1961

## 2019-03-13 NOTE — Patient Instructions (Signed)
Stretches ~ 30 sec hold x 3 reps   Cat/cow  Child's pose  Lateral stretch from child's pose   bilat knees to chest   Happy baby   Lat stretch - lying on back; pull hips and knees to chest and hold to chest; turn hands toward face (reading a book) and push hands over head - Can use left hand to push right elbow overhead   QL stretch - on back cross left leg over right and drop legs to the left   Piriformis stretch - lying on back cross right leg placing foot of floor and pull right knee diagonally across body   Seated side stretch reaching over head toward to left   Trigger Point Dry Needling  . What is Trigger Point Dry Needling (DN)? o DN is a physical therapy technique used to treat muscle pain and dysfunction. Specifically, DN helps deactivate muscle trigger points (muscle knots).  o A thin filiform needle is used to penetrate the skin and stimulate the underlying trigger point. The goal is for a local twitch response (LTR) to occur and for the trigger point to relax. No medication of any kind is injected during the procedure.   . What Does Trigger Point Dry Needling Feel Like?  o The procedure feels different for each individual patient. Some patients report that they do not actually feel the needle enter the skin and overall the process is not painful. Very mild bleeding may occur. However, many patients feel a deep cramping in the muscle in which the needle was inserted. This is the local twitch response.   Marland Kitchen. How Will I feel after the treatment? o Soreness is normal, and the onset of soreness may not occur for a few hours. Typically this soreness does not last longer than two days.  o Bruising is uncommon, however; ice can be used to decrease any possible bruising.  o In rare cases feeling tired or nauseous after the treatment is normal. In addition, your symptoms may get worse before they get better, this period will typically not last longer than 24 hours.   . What Can I  do After My Treatment? o Increase your hydration by drinking more water for the next 24 hours. o You may place ice or heat on the areas treated that have become sore, however, do not use heat on inflamed or bruised areas. Heat often brings more relief post needling. o You can continue your regular activities, but vigorous activity is not recommended initially after the treatment for 24 hours. o DN is best combined with other physical therapy such as strengthening, stretching, and other therapies.    TENS UNIT: This is helpful for muscle pain and spasm.   Search and Purchase a TENS 7000 2nd edition at www.tenspros.com. It should be less than $30.     TENS unit instructions: Do not shower or bathe with the unit on Turn the unit off before removing electrodes or batteries If the electrodes lose stickiness add a drop of water to the electrodes after they are disconnected from the unit and place on plastic sheet. If you continued to have difficulty, call the TENS unit company to purchase more electrodes. Do not apply lotion on the skin area prior to use. Make sure the skin is clean and dry as this will help prolong the life of the electrodes. After use, always check skin for unusual red areas, rash or other skin difficulties. If there are any skin problems, does not apply  electrodes to the same area. Never remove the electrodes from the unit by pulling the wires. Do not use the TENS unit or electrodes other than as directed. Do not change electrode placement without consultating your therapist or physician. Keep 2 fingers with between each electrode.

## 2019-03-20 ENCOUNTER — Other Ambulatory Visit: Payer: Self-pay

## 2019-03-20 ENCOUNTER — Ambulatory Visit (INDEPENDENT_AMBULATORY_CARE_PROVIDER_SITE_OTHER): Payer: PRIVATE HEALTH INSURANCE | Admitting: Rehabilitative and Restorative Service Providers"

## 2019-03-20 ENCOUNTER — Encounter: Payer: Self-pay | Admitting: Rehabilitative and Restorative Service Providers"

## 2019-03-20 DIAGNOSIS — M545 Low back pain, unspecified: Secondary | ICD-10-CM

## 2019-03-20 DIAGNOSIS — R29898 Other symptoms and signs involving the musculoskeletal system: Secondary | ICD-10-CM | POA: Diagnosis not present

## 2019-03-20 NOTE — Therapy (Signed)
[facility] [address] [place], [place], [address] Phone: [phone]   Fax:  [phone]  Physical Therapy Treatment  Patient Details  Name: Teresa Taylor MRN: [id number] Date of Birth: 11/29/1960 Referring Provider (PT): Dr Clementeen GrahamEvan Corey    Encounter Date: 03/20/2019  PT End of Session - 03/20/19 1609    Visit Number  2    Number of Visits  12    Date for PT Re-Evaluation  04/24/19    PT Start Time  1606    PT Stop Time  1655    PT Time Calculation (min)  49 min    Activity Tolerance  Patient tolerated treatment well       Past Medical History:  Diagnosis Date  . Allergy     History reviewed. No pertinent surgical history.  There were no vitals filed for this visit.  Subjective Assessment - 03/20/19 1610    Subjective  Doing really well until night before last when she was reaching up - felt a sharp pain in the Rt LB/hip area and has had increased pain and tightness since then. she has been doing her stretching and does feel a bit better today.    Currently in Pain?  Yes    Pain Score  1     Pain Location  Back    Pain Orientation  Right;Lower    Pain Descriptors / Indicators  Sore    Pain Type  Acute pain    Pain Onset  More than a month ago    Pain Frequency  Intermittent                       [facility] Adult PT Treatment/Exercise - 03/20/19 0001      Therapeutic Activites    Therapeutic Activities  --   myofacial ball release work      Lumbar Exercises: Stretches   Double Knee to Chest Stretch  2 reps;30 seconds   knees apart (happy baby)    Lower Trunk Rotation Limitations  QL stretch crossing left LE over Rt dropping legs to the left 30-40 sec x 2 reps     Piriformis Stretch  Right;30 seconds;4 reps   supine travell    Other Lumbar Stretch Exercise  lat stretch supine 30 -45 sec x 3 reps       Lumbar Exercises: Seated   Sit to Stand  5 reps   slow eccentric stand to sit      Lumbar Exercises: Supine   Other Supine Lumbar Exercises  4 part core 10 sec x 10 reps     Other Supine Lumbar Exercises  clam with green TB holding one LE still moving opposite - core tight x 10 reps       Lumbar Exercises: Quadruped   Madcat/Old Horse  5 reps    Other Quadruped Lumbar Exercises  child's pose x 2 for 30 sec     Other Quadruped Lumbar Exercises  lateral stretch from child's pose to left 30 sec x 2 reps       Knee/Hip Exercises: Aerobic   Nustep  L5 x 5 min       Shoulder Exercises: Seated   Other Seated Exercises  lateral trunk flexion to Lt to stretch Rt LB/side       Manual Therapy   Manual therapy comments  pt prone     Soft tissue mobilization  deep tissue work through the Rt lumbar and hip  musculature     Myofascial Release  Rt lumbar to posterior hip              PT Education - 03/20/19 1635    Education Details  HEP    Person(s) Educated  Patient    Methods  Explanation;Demonstration;Tactile cues;Verbal cues;Handout    Comprehension  Verbalized understanding;Returned demonstration;Verbal cues required;Tactile cues required          PT Long Term Goals - 03/13/19 1003      PT LONG TERM GOAL #1   Title  Decrease pain with patient to report 0-1 incidents of back pain during the course of her day 04/24/2019    Time  6    Period  Weeks    Status  New      PT LONG TERM GOAL #2   Title  Decresae palpable tightness Rt lumbar musculature to WNLs 04/24/2019    Time  6    Period  Weeks    Status  New      PT LONG TERM GOAL #3   Title  Independent in HEP 04/24/2019    Time  6    Period  Weeks    Status  New      PT LONG TERM GOAL #4   Title  Improve FOTO to </+ 30% limitation 04/24/2019    Time  6    Period  Weeks    Status  New            Plan - 03/20/19 1610    Clinical Impression Statement  Patient was progressing well with fewer episodes of sharp pain until day before yesterday when seh was reaching overhead. Stretches have helped  but she is still tight. Reviewed HEP and added exercises without difficulty. Progressing well with goals with flare up of pain.    Stability/Clinical Decision Making  Stable/Uncomplicated    Rehab Potential  Good    PT Frequency  2x / week    PT Duration  6 weeks    PT Treatment/Interventions  Patient/family education;ADLs/Self Care Home Management;Cryotherapy;Electrical Stimulation;Iontophoresis 4mg /ml Dexamethasone;Moist Heat;Ultrasound;Manual techniques;Neuromuscular re-education;Dry needling;Therapeutic activities;Therapeutic exercise    PT Next Visit Plan  review HEP; assess continued pain and tightness; trial of DN if tightness persists; manual work and modalities as indicated    PT Home Exercise Plan  [id number]    Consulted and Agree with Plan of Care  Patient       Patient will benefit from skilled therapeutic intervention in order to improve the following deficits and impairments:  Pain, Increased fascial restricitons, Increased muscle spasms, Decreased mobility, Decreased activity tolerance  Visit Diagnosis: 1. Acute right-sided low back pain without sciatica   2. Other symptoms and signs involving the musculoskeletal system        Problem List Patient Active Problem List   Diagnosis Date Noted  . Low energy 05/20/2018  . Excessive daytime sleepiness 05/20/2018  . Overweight 05/09/2017  . Microcytic anemia 04/10/2016  . Atypical chest pain 04/08/2016  . IDA (iron deficiency anemia) 04/08/2016  . Depression 10/27/2015  . Generalized anxiety disorder 10/26/2015  . Abnormal weight gain 09/18/2015  . Muscle spasm of back 02/21/2014  . Right low back pain 02/21/2014  . ADHD (attention deficit hyperactivity disorder) 01/09/2013  . DOMESTIC ABUSE 02/06/2009  . INSOMNIA 02/15/2008  . TACHYCARDIA, PAROXYSMAL NOS 03/01/2007    Karesha Trzcinski Rober MinionP Lennie Dunnigan PT, MPH 03/20/2019, 4:51 PM  [facility] [address] [place], [place],  [place] Phone: [phone]   Fax:  [phone]  Name: Teresa Taylor MRN: [id number] Date of Birth: 1961/07/29

## 2019-03-20 NOTE — Patient Instructions (Signed)
Access Code: [email or web address]  URL: [email or web address]  Date: 03/20/2019  Prepared by: Gillermo Murdoch   Exercises  Cat Cow - 3 reps - 1 sets - 30 sec hold - 2x daily - 7x weekly  Supine Piriformis Stretch with Leg Straight - 3 reps - 1 sets - 30 seconds hold - 2x daily - 7x weekly  Childs Pose - 3 reps - 1 sets - 30 sec hold - 2x daily - 7x weekly  Supine Single Knee to Chest - 3 reps - 1 sets - 30 sec hold - 2x daily - 7x weekly  Seated Sidebending - 3 reps - 1 sets - 30 sec hold - 2x daily - 7x weekly  Supine Transversus Abdominis Bracing - Hands on Stomach - 10 reps - 1 sets - 10 sec hold - 3-4x daily - 7x weekly  Hooklying Isometric Clamshell - 10 reps - 1 sets - 10 sec hold - 1x daily - 7x weekly  Sit to Stand - 10 reps - 1 sets - 2x daily - 7x weekly  Supine Lower Trunk Rotation - 3 reps - 1 sets - 30 sec hold - 2x daily - 7x weekly

## 2019-03-26 ENCOUNTER — Encounter: Payer: Self-pay | Admitting: Physician Assistant

## 2019-03-26 ENCOUNTER — Telehealth (INDEPENDENT_AMBULATORY_CARE_PROVIDER_SITE_OTHER): Payer: PRIVATE HEALTH INSURANCE | Admitting: Physician Assistant

## 2019-03-26 VITALS — BP 128/82 | HR 71 | Ht 64.0 in | Wt 150.0 lb

## 2019-03-26 DIAGNOSIS — F900 Attention-deficit hyperactivity disorder, predominantly inattentive type: Secondary | ICD-10-CM

## 2019-03-26 DIAGNOSIS — F411 Generalized anxiety disorder: Secondary | ICD-10-CM

## 2019-03-26 DIAGNOSIS — F329 Major depressive disorder, single episode, unspecified: Secondary | ICD-10-CM | POA: Diagnosis not present

## 2019-03-26 MED ORDER — VIIBRYD 20 MG PO TABS: 1.0000 | ORAL_TABLET | Freq: Every day | ORAL | 2 refills | Status: DC

## 2019-03-26 MED ORDER — LISDEXAMFETAMINE DIMESYLATE 20 MG PO CAPS: 20.0000 mg | ORAL_CAPSULE | ORAL | 0 refills | Status: DC

## 2019-03-26 NOTE — Progress Notes (Signed)
Patient ID: Teresa Taylor, female   DOB: 08/18/1961, 58 y.o.   MRN: [id number] .Marland Kitchen.Virtual Visit via Video Note  I connected with Teresa Taylor on 03/27/19 at  3:40 PM EDT by a video enabled telemedicine application and verified that I am speaking with the correct person using two identifiers.  Location: Patient: home Provider: home   I discussed the limitations of evaluation and management by telemedicine and the availability of in person appointments. The patient expressed understanding and agreed to proceed.  History of Present Illness: Pt is a 58 yo female with hx of situational depression and anxiety with ADHD. She is having some problems at work with her boss and has left her anxious and battling depression. She finds herself losing motivation and just not wanting to 'deal with it". She denies any SI/HC. She has been controlled on vyvanse but worried it was making anxiety worse so she stopped vyvanse and re-started viibryd that she had from last time this happened. She continues to exercise and work on breathing. She does feel like started viibryd helped her mood but now her focus is terrible. She wanted to know what to do.   .. Active Ambulatory Problems    Diagnosis Date Noted  . TACHYCARDIA, PAROXYSMAL NOS 03/01/2007  . INSOMNIA 02/15/2008  . DOMESTIC ABUSE 02/06/2009  . ADHD (attention deficit hyperactivity disorder) 01/09/2013  . Muscle spasm of back 02/21/2014  . Right low back pain 02/21/2014  . Abnormal weight gain 09/18/2015  . Generalized anxiety disorder 10/26/2015  . Depression 10/27/2015  . Atypical chest pain 04/08/2016  . IDA (iron deficiency anemia) 04/08/2016  . Microcytic anemia 04/10/2016  . Overweight 05/09/2017  . Low energy 05/20/2018  . Excessive daytime sleepiness 05/20/2018   Resolved Ambulatory Problems    Diagnosis Date Noted  . No Resolved Ambulatory Problems   Past Medical History:  Diagnosis Date  . Allergy    Reviewed med, allergy, problem  list.    Observations/Objective: No acute distress. Normal mood. Normal breathing.   .. Today's Vitals   03/26/19 1456  BP: 128/82  Pulse: 71  Weight: 150 lb (68 kg)  Height: 5\' 4"  (1.626 m)   Body mass index is 25.75 kg/m.   .. Depression screen [facility] 2[facility]9 03/26/2019 05/18/2018 05/05/2017  Decreased Interest 3 1 0  Down, Depressed, Hopeless 3 1 0  PHQ - 2 Score 6 2 0  Altered sleeping 3 3 -  Tired, decreased energy 3 3 -  Change in appetite 0 0 -  Feeling bad or failure about yourself  0 0 -  Trouble concentrating 3 3 -  Moving slowly or fidgety/restless 3 0 -  Suicidal thoughts 0 0 -  PHQ-9 Score 18 11 -  Difficult doing work/chores Very difficult Extremely dIfficult -   .. GAD 7 : Generalized Anxiety Score 03/26/2019 05/18/2018  Nervous, Anxious, on Edge 3 1  Control/stop worrying 3 1  Worry too much - different things 3 1  Trouble relaxing 3 1  Restless 3 0  Easily annoyed or irritable 1 0  Afraid - awful might happen 1 0  Total GAD 7 Score 17 4  Anxiety Difficulty Extremely difficult Not difficult at all     Assessment and Plan: Marland Kitchen.Marland Kitchen.Jenel was seen today for depression and anxiety.  Diagnoses and all orders for this visit:  Generalized anxiety disorder -     Vilazodone HCl (VIIBRYD) 20 MG TABS; Take 1 tablet (20 mg total) by mouth daily.  Attention deficit hyperactivity disorder (  ADHD), predominantly inattentive type -     lisdexamfetamine (VYVANSE) 20 MG capsule; Take 1 capsule (20 mg total) by mouth every morning. -     lisdexamfetamine (VYVANSE) 20 MG capsule; Take 1 capsule (20 mg total) by mouth every morning.  Reactive depression -     Vilazodone HCl (VIIBRYD) 20 MG TABS; Take 1 tablet (20 mg total) by mouth daily.   PHQ-9 and GAD-7 numbers were definitely elevated. Continue on viibryd. Restart vyvanse. Ok to be on together. Encouraged staying on Viibryd more long term. Discussed mindfulness, exercise, mediation, and strategies to work with boss. Follow  up in 3 months or as needed. Just refilled vyvanse. Sent other 2 rx.   Follow Up Instructions:    I discussed the assessment and treatment plan with the patient. The patient was provided an opportunity to ask questions and all were answered. The patient agreed with the plan and demonstrated an understanding of the instructions.   The patient was advised to call back or seek an in-person evaluation if the symptoms worsen or if the condition fails to improve as anticipated.     Iran Planas, PA-C

## 2019-03-26 NOTE — Progress Notes (Deleted)
Patient having situational depression/anxiety. States issues with work. PHQ9-GAD7 completed.  She had viibryd left over where she was on it in the past - so she started that a couple days ago which has helped some. She stopped her Vyvanse because she knows this can worsen anxiety and she wasn't sure if she could be on both. Now her ADHD is worse. Anxiety was better off it, but depression was no better. Wants to discuss treatment for ADHD and Depression/anxiety.

## 2019-03-27 ENCOUNTER — Encounter: Payer: Self-pay | Admitting: Rehabilitative and Restorative Service Providers"

## 2019-03-27 ENCOUNTER — Other Ambulatory Visit: Payer: Self-pay

## 2019-03-27 ENCOUNTER — Ambulatory Visit (INDEPENDENT_AMBULATORY_CARE_PROVIDER_SITE_OTHER): Payer: Worker's Compensation | Admitting: Rehabilitative and Restorative Service Providers"

## 2019-03-27 ENCOUNTER — Encounter: Payer: Self-pay | Admitting: Physician Assistant

## 2019-03-27 ENCOUNTER — Telehealth: Payer: Self-pay

## 2019-03-27 DIAGNOSIS — M545 Low back pain, unspecified: Secondary | ICD-10-CM

## 2019-03-27 DIAGNOSIS — R29898 Other symptoms and signs involving the musculoskeletal system: Secondary | ICD-10-CM | POA: Diagnosis not present

## 2019-03-27 DIAGNOSIS — F900 Attention-deficit hyperactivity disorder, predominantly inattentive type: Secondary | ICD-10-CM

## 2019-03-27 NOTE — Patient Instructions (Signed)

## 2019-03-27 NOTE — Telephone Encounter (Signed)
One prescription was e-prescribed the other printed. No July prescription.

## 2019-03-27 NOTE — Telephone Encounter (Signed)
The pharmacy called and states that one of the Vyvanse was faxed in instead of e-prescribed. Also I noticed a prescription for July was not sent. Just August and September.

## 2019-03-27 NOTE — Therapy (Signed)
[facility] [address] [place], [place], [address] Phone: [phone]   Fax:  [phone]  Physical Therapy Treatment  Patient Details  Name: Teresa Medinammy Taylor MRN: [id number] Date of Birth: 05/27/1961 Referring Provider (PT): Dr Clementeen GrahamEvan Corey    Encounter Date: 03/27/2019  PT End of Session - 03/27/19 1607    Visit Number  3    Number of Visits  12    Date for PT Re-Evaluation  04/24/19    PT Start Time  1606    PT Stop Time  1650   moist heat end of treatment   PT Time Calculation (min)  44 min    Activity Tolerance  Patient tolerated treatment well       Past Medical History:  Diagnosis Date  . Allergy     History reviewed. No pertinent surgical history.  There were no vitals filed for this visit.  Subjective Assessment - 03/27/19 1611    Subjective  Patient reports that she is doing well "until she's not" will have a sharp pain with certain movements.Sharp pain was twice a day - now every other day.    Currently in Pain?  No/denies         [facility] Assessment - 03/27/19 0001      Assessment   Medical Diagnosis  lumbosacral strain    Referring Provider (PT)  Dr Clementeen GrahamEvan Corey     Onset Date/Surgical Date  02/14/19    Hand Dominance  Right    Next MD Visit  04/03/2019    Prior Therapy  yes       AROM   AROM Assessment Site  --   trunk and LE ROM - [facility]      Flexibility   Soft Tissue Assessment /Muscle Length  --   WFL's in muscle groups noted below      Palpation   Spinal mobility  WFL's lumbar spine     Palpation comment  decreased muscular tightness Rt lats; QL; lower thoracic/lumbar paraspinals                    [facility] Adult PT Treatment/Exercise - 03/27/19 0001      Lumbar Exercises: Stretches   Double Knee to Chest Stretch  2 reps;30 seconds   knees apart (happy baby)    Lower Trunk Rotation Limitations  QL stretch crossing left LE over Rt dropping legs to the left 30-40 sec x 2 reps      Piriformis Stretch  Right;30 seconds;4 reps   supine travell    Other Lumbar Stretch Exercise  lat stretch supine 30 -45 sec x 3 reps       Lumbar Exercises: Seated   Sit to Stand  5 reps   slow eccentric stand to sit      Lumbar Exercises: Supine   Other Supine Lumbar Exercises  4 part core 10 sec x 10 reps       Lumbar Exercises: Quadruped   Madcat/Old Horse  5 reps    Other Quadruped Lumbar Exercises  child's pose x 2 for 30 sec     Other Quadruped Lumbar Exercises  lateral stretch from child's pose to left 30 sec x 2 reps       Knee/Hip Exercises: Aerobic   Nustep  L5 x 5 min       Shoulder Exercises: Seated   Other Seated Exercises  lateral trunk flexion to Lt to stretch Rt LB/side  Moist Heat Therapy   Number Minutes Moist Heat  12 Minutes    Moist Heat Location  Lumbar Spine      Manual Therapy   Manual therapy comments  pt prone and sideying     Soft tissue mobilization  deep tissue work through the Rt QL/lumbar spine     Myofascial Release  Rt lumbar        Trigger Point Dry Needling - 03/27/19 0001    Consent Given?  Yes    Education Handout Provided  Yes    Other Dry Needling  Rt     Quadratus Lumborum Response  Palpable increased muscle length   pt Lt sidelying over pillow           PT Education - 03/27/19 1639    Education Details  DN    Person(s) Educated  Patient    Methods  Explanation    Comprehension  Verbalized understanding          PT Long Term Goals - 03/13/19 1003      PT LONG TERM GOAL #1   Title  Decrease pain with patient to report 0-1 incidents of back pain during the course of her day 04/24/2019    Time  6    Period  Weeks    Status  New      PT LONG TERM GOAL #2   Title  Decresae palpable tightness Rt lumbar musculature to WNLs 04/24/2019    Time  6    Period  Weeks    Status  New      PT LONG TERM GOAL #3   Title  Independent in HEP 04/24/2019    Time  6    Period  Weeks    Status  New      PT LONG TERM  GOAL #4   Title  Improve FOTO to </+ 30% limitation 04/24/2019    Time  6    Period  Weeks    Status  New            Plan - 03/27/19 1611    Clinical Impression Statement  Continued improvement - consistent work with stretching at home. Frequency of pain in the Rt LB has improved significantly. Trial of manual work and DN to address persistnet tightness in Rt LB. Patient will continue with HEP. Returns to MD next week and will call to schedule as needed.    Stability/Clinical Decision Making  Stable/Uncomplicated    Rehab Potential  Good    PT Frequency  2x / week    PT Duration  6 weeks    PT Treatment/Interventions  Patient/family education;ADLs/Self Care Home Management;Cryotherapy;Electrical Stimulation;Iontophoresis 4mg /ml Dexamethasone;Moist Heat;Ultrasound;Manual techniques;Neuromuscular re-education;Dry needling;Therapeutic activities;Therapeutic exercise    PT Next Visit Plan  review HEP; assess response to DN and manual work; modalities as indicated    PT Regent and Agree with Plan of Care  Patient       Patient will benefit from skilled therapeutic intervention in order to improve the following deficits and impairments:  Pain, Increased fascial restricitons, Increased muscle spasms, Decreased mobility, Decreased activity tolerance  Visit Diagnosis: 1. Acute right-sided low back pain without sciatica   2. Other symptoms and signs involving the musculoskeletal system        Problem List Patient Active Problem List   Diagnosis Date Noted  . Low energy 05/20/2018  . Excessive daytime sleepiness 05/20/2018  . Overweight 05/09/2017  .  Microcytic anemia 04/10/2016  . Atypical chest pain 04/08/2016  . IDA (iron deficiency anemia) 04/08/2016  . Depression 10/27/2015  . Generalized anxiety disorder 10/26/2015  . Abnormal weight gain 09/18/2015  . Muscle spasm of back 02/21/2014  . Right low back pain 02/21/2014  . ADHD (attention  deficit hyperactivity disorder) 01/09/2013  . DOMESTIC ABUSE 02/06/2009  . INSOMNIA 02/15/2008  . TACHYCARDIA, PAROXYSMAL NOS 03/01/2007    Jaziya Obarr Rober MinionP Chimene Salo PT, MPH  03/27/2019, 4:50 PM  [facility] [address] [place], [place], [address] Phone: [phone]   Fax:  [phone]  Name: Teresa Taylor MRN: [id number] Date of Birth: 03/04/1961

## 2019-03-27 NOTE — Telephone Encounter (Signed)
Well if one was faxed yesterday I did not sign it. I was not there. I sent to electronically yesterday. She had stated she had just picked July up and why I did not send.

## 2019-03-28 ENCOUNTER — Encounter: Payer: Self-pay | Admitting: Physician Assistant

## 2019-03-28 NOTE — Telephone Encounter (Signed)
Can we get new patient scheduled virtually?

## 2019-03-28 NOTE — Telephone Encounter (Signed)
Scheduler advised and contacting pt to get scheduled

## 2019-03-29 ENCOUNTER — Encounter: Payer: Self-pay | Admitting: Physician Assistant

## 2019-04-03 ENCOUNTER — Ambulatory Visit (INDEPENDENT_AMBULATORY_CARE_PROVIDER_SITE_OTHER): Payer: Worker's Compensation | Admitting: Family Medicine

## 2019-04-03 ENCOUNTER — Ambulatory Visit (INDEPENDENT_AMBULATORY_CARE_PROVIDER_SITE_OTHER): Payer: PRIVATE HEALTH INSURANCE

## 2019-04-03 ENCOUNTER — Other Ambulatory Visit: Payer: Self-pay

## 2019-04-03 ENCOUNTER — Encounter: Payer: Self-pay | Admitting: Family Medicine

## 2019-04-03 VITALS — BP 135/64 | HR 83 | Temp 98.3°F | Wt 155.0 lb

## 2019-04-03 DIAGNOSIS — S39012A Strain of muscle, fascia and tendon of lower back, initial encounter: Secondary | ICD-10-CM | POA: Diagnosis not present

## 2019-04-03 NOTE — Progress Notes (Signed)
Teresa Taylor is a 58 y.o. female who presents to [place] today for follow-up back pain.  Patient was seen about a month ago for lumbosacral strain.  She had physical therapy and did quite well with the stretching and exercises.  She had an episode of dry needling which worsened her pain.  She is improving again but notes intermittent areas of sharp back spasms that last a few seconds.  This can be very painful at times.  Overall she is improved a bit but still symptomatic.  She denies radiating pain weakness or numbness distally.  She denies new bowel or bladder dysfunction.   ROS:  As above  Exam:  BP 135/64   Pulse 83   Temp 98.3 F (36.8 C) (Oral)   Wt 155 lb (70.3 kg)   BMI 26.61 kg/m  Wt Readings from Last 5 Encounters:  04/03/19 155 lb (70.3 kg)  03/26/19 150 lb (68 kg)  03/06/19 154 lb (69.9 kg)  09/26/18 154 lb (69.9 kg)  05/18/18 155 lb (70.3 kg)   General: Well Developed, well nourished, and in no acute distress.  Neuro/Psych: Alert and oriented x3, extra-ocular muscles intact, able to move all 4 extremities, sensation grossly intact. Skin: Warm and dry, no rashes noted.  Respiratory: Not using accessory muscles, speaking in full sentences, trachea midline.  Cardiovascular: Pulses palpable, no extremity edema. Abdomen: Does not appear distended. MSK: L-spine: Nontender to spinal midline.  Normal lumbar motion.  Lower extremity strength is intact.  Normal gait.    Lab and Radiology Results L-spine x-ray ordered and is currently pending    Assessment and Plan: 58 y.o. female with continued low back pain.  Pain is much more consistent with spasm and is quite intermittent.  Plan to continue home exercise program and some physical therapy.  If no further benefit reasonable to consider chiropractic care.  Will obtain x-ray today.  If not better in about 6 weeks will return to clinic and consider MRI and further  evaluation and management.   PDMP not reviewed this encounter. No orders of the defined types were placed in this encounter.  No orders of the defined types were placed in this encounter.   Historical information moved to improve visibility of documentation.  Past Medical History:  Diagnosis Date  . Allergy    No past surgical history on file. Social History   Tobacco Use  . Smoking status: Never Smoker  . Smokeless tobacco: Never Used  Substance Use Topics  . Alcohol use: Not on file   family history includes Arthritis in her father; Dementia in her maternal grandmother; Hypertension in her maternal grandmother; Stroke in her maternal grandmother; Supraventricular tachycardia in her mother.  Medications: Current Outpatient Medications  Medication Sig Dispense Refill  . cholecalciferol (VITAMIN D) 1000 units tablet Take 1,000 Units by mouth daily.    Derrill Memo ON 04/26/2019] lisdexamfetamine (VYVANSE) 20 MG capsule Take 1 capsule (20 mg total) by mouth every morning. 30 capsule 0  . [START ON 05/27/2019] lisdexamfetamine (VYVANSE) 20 MG capsule Take 1 capsule (20 mg total) by mouth every morning. 30 capsule 0  . Vilazodone HCl (VIIBRYD) 20 MG TABS Take 1 tablet (20 mg total) by mouth daily. 30 tablet 2   No current facility-administered medications for this visit.    Allergies  Allergen Reactions  . Ativan [Lorazepam]     Strange reaction of amensia      Discussed warning signs or symptoms. Please see  discharge instructions. Patient expresses understanding.

## 2019-04-03 NOTE — Patient Instructions (Addendum)
Thank you for coming in today. Continue a bit more PT and home exercises.   Consider chiropractor if not further benefit.   Get xray now.   I will look at the pictures today and get results to you when radiology generates a report.   Recheck with me in 6 weeks.

## 2019-04-05 ENCOUNTER — Encounter: Payer: Self-pay | Admitting: Family Medicine

## 2019-04-10 ENCOUNTER — Ambulatory Visit (INDEPENDENT_AMBULATORY_CARE_PROVIDER_SITE_OTHER): Payer: Worker's Compensation | Admitting: Rehabilitative and Restorative Service Providers"

## 2019-04-10 ENCOUNTER — Other Ambulatory Visit: Payer: Self-pay

## 2019-04-10 ENCOUNTER — Encounter: Payer: Self-pay | Admitting: Rehabilitative and Restorative Service Providers"

## 2019-04-10 DIAGNOSIS — R29898 Other symptoms and signs involving the musculoskeletal system: Secondary | ICD-10-CM | POA: Diagnosis not present

## 2019-04-10 DIAGNOSIS — M545 Low back pain, unspecified: Secondary | ICD-10-CM

## 2019-04-10 NOTE — Therapy (Addendum)
[facility] [place], [place], [address] Phone: [phone]   Fax:  [phone]  Physical Therapy Treatment  Patient Details  Name: Teresa Taylor MRN: [id number] Date of Birth: 1961/02/13 Referring Provider (PT): Dr Lynne Leader    Encounter Date: 04/10/2019  PT End of Session - 04/10/19 0851    Visit Number  4    Number of Visits  12    Date for PT Re-Evaluation  04/24/19    PT Start Time  6144    PT Stop Time  0930    PT Time Calculation (min)  43 min    Activity Tolerance  Patient tolerated treatment well       Past Medical History:  Diagnosis Date  . Allergy     History reviewed. No pertinent surgical history.  There were no vitals filed for this visit.  Subjective Assessment - 04/10/19 0852    Subjective  Patient reports that she has fewinr incidents of the sharp pain - noticed it last night rolling over in bed. MRI showed DDD and abnormal disc    Currently in Pain?  No/denies                       [facility] Adult PT Treatment/Exercise - 04/10/19 0001      Lumbar Exercises: Stretches   Piriformis Stretch  Right;30 seconds;4 reps   crossing legs pulling opposite knee to chest      Lumbar Exercises: Seated   Other Seated Lumbar Exercises  segmental mobility with ant/post pelvic tilt       Lumbar Exercises: Supine   Dead Bug  10 reps    Bridge  5 reps    Other Supine Lumbar Exercises  4 part core 10 sec x 10 reps       Lumbar Exercises: Quadruped   Madcat/Old Horse  5 reps    Other Quadruped Lumbar Exercises  child's pose x 2 for 30 sec     Other Quadruped Lumbar Exercises  lateral stretch from child's pose to left 30 sec x 2 reps       Knee/Hip Exercises: Aerobic   Nustep  L5 x 5 min       Manual Therapy   Manual therapy comments  pt prone     Soft tissue mobilization  deep tissue work through the Rt lumbar paraspinals/QL/lats     Myofascial Release  Rt lumbar               PT Education - 04/10/19 3154    Education Details  HEP    Person(s) Educated  Patient    Methods  Explanation;Demonstration;Tactile cues;Verbal cues;Handout    Comprehension  Verbalized understanding;Returned demonstration;Verbal cues required;Tactile cues required          PT Long Term Goals - 03/13/19 1003      PT LONG TERM GOAL #1   Title  Decrease pain with patient to report 0-1 incidents of back pain during the course of her day 04/24/2019    Time  6    Period  Weeks    Status  New      PT LONG TERM GOAL #2   Title  Decresae palpable tightness Rt lumbar musculature to WNLs 04/24/2019    Time  6    Period  Weeks    Status  New      PT LONG TERM GOAL #3   Title  Independent in HEP 04/24/2019  Time  6    Period  Weeks    Status  New      PT LONG TERM GOAL #4   Title  Improve FOTO to </+ 30% limitation 04/24/2019    Time  6    Period  Weeks    Status  New            Plan - 04/10/19 0630    Clinical Impression Statement  Intermittent sharp shooting pain with certain movements - had not happened in two days then experienced it last night. Patient has poor segmental mobilty thorugh the Rt lumbar spine. She worked on this today with notable improvement. Also added core stabilization. Patient will continue to work on HEP and return in two weeks - prior to appt with Dr Georgina Snell.    Stability/Clinical Decision Making  Stable/Uncomplicated    Rehab Potential  Good    PT Frequency  2x / week    PT Duration  6 weeks    PT Treatment/Interventions  Patient/family education;ADLs/Self Care Home Management;Cryotherapy;Electrical Stimulation;Iontophoresis 64m/ml Dexamethasone;Moist Heat;Ultrasound;Manual techniques;Neuromuscular re-education;Dry needling;Therapeutic activities;Therapeutic exercise    PT Next Visit Plan  review HEP; assess response to deep tissue/manual work and new exercises esp segmental mobility; modalities as indicated    PT Home Exercise Plan   [email or web address]   Consulted and Agree with Plan of Care  Patient       Patient will benefit from skilled therapeutic intervention in order to improve the following deficits and impairments:  Pain, Increased fascial restricitons, Increased muscle spasms, Decreased mobility, Decreased activity tolerance  Visit Diagnosis: 1. Acute right-sided low back pain without sciatica   2. Other symptoms and signs involving the musculoskeletal system        Problem List Patient Active Problem List   Diagnosis Date Noted  . Low energy 05/20/2018  . Excessive daytime sleepiness 05/20/2018  . Overweight 05/09/2017  . Microcytic anemia 04/10/2016  . Atypical chest pain 04/08/2016  . IDA (iron deficiency anemia) 04/08/2016  . Depression 10/27/2015  . Generalized anxiety disorder 10/26/2015  . Abnormal weight gain 09/18/2015  . Muscle spasm of back 02/21/2014  . Right low back pain 02/21/2014  . ADHD (attention deficit hyperactivity disorder) 01/09/2013  . DOMESTIC ABUSE 02/06/2009  . INSOMNIA 02/15/2008  . TACHYCARDIA, PAROXYSMAL NOS 03/01/2007    Celyn PNilda SimmerPT, MPH  04/10/2019, 2:25 PM  [facility] [address] [address]Phone: [phone]  Fax:  [phone] Name: Teresa CraveyMRN: [id number] of Birth: 409/24/1962 PHYSICAL THERAPY DISCHARGE SUMMARY  Visits from Start of Care: 4  Current functional level related to goals / functional outcomes: See last progress note for discharge status   Remaining deficits: Unknown    Education / Equipment: HEP  Plan: Patient agrees to discharge.  Patient goals were partially met. Patient is being discharged due to being pleased with the current functional level.  ?????    Celyn P. HHelene KelpPT, MPH 05/10/19 9:20 AM

## 2019-04-10 NOTE — Patient Instructions (Signed)
Access Code: [email or web address]  URL: [email or web address]  Date: 04/10/2019  Prepared by: Gillermo Murdoch   Exercises  Cat Cow - 3 reps - 1 sets - 30 sec hold - 2x daily - 7x weekly  Supine Piriformis Stretch with Leg Straight - 3 reps - 1 sets - 30 seconds hold - 2x daily - 7x weekly  Childs Pose - 3 reps - 1 sets - 30 sec hold - 2x daily - 7x weekly  Supine Single Knee to Chest - 3 reps - 1 sets - 30 sec hold - 2x daily - 7x weekly  Seated Sidebending - 3 reps - 1 sets - 30 sec hold - 2x daily - 7x weekly  Supine Transversus Abdominis Bracing - Hands on Stomach - 10 reps - 1 sets - 10 sec hold - 3-4x daily - 7x weekly  Hooklying Isometric Clamshell - 10 reps - 1 sets - 10 sec hold - 1x daily - 7x weekly  Sit to Stand - 10 reps - 1 sets - 2x daily - 7x weekly  Supine Lower Trunk Rotation - 3 reps - 1 sets - 30 sec hold - 2x daily - 7x weekly  Dead Bug Alternating Arm Extension - 10 reps - 1 sets - 2-3 sec hold - 1x daily - 7x weekly  Supine Bridge - 10 reps - 1 sets - 10 sec hold - 1x daily - 7x weekly  Quadruped Alternating Leg Extensions - 10 reps - 1 sets - 2-3 sec hold - 1x daily - 7x weekly  Quadruped Alternating Arm Lift - 10 reps - 1 sets - 2-3 sec hold - 1x daily - 7x weekly

## 2019-04-16 ENCOUNTER — Telehealth: Payer: Self-pay

## 2019-04-16 NOTE — Telephone Encounter (Signed)
[facility] called and states the fill date on the Adderall not the correct fill date. She picked up her last prescription on 03/07/2019. The next should be for the first of August then the first of September. They both read the 21st. I did look in the chart and the last prescription that was sent was at the end of June. I did confirm that the patient picked up her last prescription on July 2nd. I did give the ok to fill today. There was not a prescription in the chart for July.

## 2019-04-17 NOTE — Telephone Encounter (Signed)
Okay, sounds perfect.  It looks like she already has 1 set up for August and one for September so she should be good until her follow-up.  Not sure what happened to [facility] prescription.

## 2019-05-15 ENCOUNTER — Ambulatory Visit (INDEPENDENT_AMBULATORY_CARE_PROVIDER_SITE_OTHER): Payer: Worker's Compensation | Admitting: Family Medicine

## 2019-05-15 ENCOUNTER — Other Ambulatory Visit: Payer: Self-pay

## 2019-05-15 ENCOUNTER — Encounter: Payer: Self-pay | Admitting: Family Medicine

## 2019-05-15 VITALS — BP 126/78 | HR 69 | Wt 157.0 lb

## 2019-05-15 DIAGNOSIS — M545 Low back pain, unspecified: Secondary | ICD-10-CM

## 2019-05-15 NOTE — Patient Instructions (Signed)
Thank you for coming in today. You should hear about MRI soon.  Let me know if you do not hear anything soon.  Recheck sooner if needed.   Check after MRI.

## 2019-05-15 NOTE — Progress Notes (Signed)
Teresa Taylor is a 58 y.o. female who presents to [place] today for follow-up back pain.  Patient was seen in early July for workplace related injury resulting in low back pain thought to be lumbosacral strain.  She had trial of physical therapy and overall did pretty well was still having a little bit of difficulty when last assessed July 29.  At that time we proceeded with a bit more physical therapy and possible chiropractor care.  X-ray showed some degenerative changes at L2-3 but otherwise was normal.  In the interim she notes she is continued to experience sharp occasional pain located in the right lower back.  Pain occurs intermittently with activity and motion.  She has had improvement with physical therapy but still has persistent pain that she finds very obnoxious and annoying.  She denies significant pain radiating down her legs weakness or numbness distally.    ROS:  As above  Exam:  BP 126/78   Pulse 69   Wt 157 lb (71.2 kg)   SpO2 98%   BMI 26.95 kg/m  Wt Readings from Last 5 Encounters:  05/15/19 157 lb (71.2 kg)  04/03/19 155 lb (70.3 kg)  03/26/19 150 lb (68 kg)  03/06/19 154 lb (69.9 kg)  09/26/18 154 lb (69.9 kg)   General: Well Developed, well nourished, and in no acute distress.  Neuro/Psych: Alert and oriented x3, extra-ocular muscles intact, able to move all 4 extremities, sensation grossly intact. Skin: Warm and dry, no rashes noted.  Respiratory: Not using accessory muscles, speaking in full sentences, trachea midline.  Cardiovascular: Pulses palpable, no extremity edema. Abdomen: Does not appear distended. MSK: L-spine: Nontender to spinal midline.  Tender palpation right paraspinal muscular area and right SI joint region. Normal lumbar motion pain with extension and flexion. Normal gait.  Lower extremity strength is intact.    Lab and Radiology Results EXAM: LUMBAR SPINE - COMPLETE 4+ VIEW   COMPARISON:  Lumbar spine radiograph dated 02/19/2014 and 09/13/2011  FINDINGS: There are 6 non-rib-bearing lumbar type vertebral bodies. Minimal retrolisthesis of L2 on L3. Otherwise alignment is intact. Vertebral body heights are maintained. There is moderate disc space loss at L2-3 which has increased since the prior study, as well as mild spurring and endplate sclerosis at this level. Lower lumbar facet arthropathy. Nonobstructive bowel gas pattern. No acute abnormality in the visualized pelvis.  IMPRESSION: No acute osseous abnormality in the spine. Degenerative disc disease most pronounced at L2-3.   Electronically Signed   By: Audie Pinto M.D.   On: 04/04/2019 13:54 I personally (independently) visualized and performed the interpretation of the images attached in this note.     Assessment and Plan: 58 y.o. female with low back pain.  Patient second significant degenerative changes on x-ray at L2-3 (6 total lumbar vertebrae).  She has received maximal improvement following physical therapy trial and is still suffering obnoxious bothersome pain.  At this point I think neck step is MRI L-spine to further evaluate pain and for injection planning.  Recheck after MRI.  PDMP not reviewed this encounter. Orders Placed This Encounter  Procedures  . MR Lumbar Spine Wo Contrast    Standing Status:   Future    Standing Expiration Date:   07/14/2020    Order Specific Question:   What is the patient's sedation requirement?    Answer:   No Sedation    Order Specific Question:   Does the patient have a pacemaker or  implanted devices?    Answer:   No    Order Specific Question:   [facility]?    [facility]    Order Specific Question:   Radiology Contrast Protocol - do NOT remove file path    Answer:   [email or web address]   No orders of the defined types were placed in this encounter.    Historical information moved to improve visibility of documentation.  Past Medical History:  Diagnosis Date  . Allergy    No past surgical history on file. Social History   Tobacco Use  . Smoking status: Never Smoker  . Smokeless tobacco: Never Used  Substance Use Topics  . Alcohol use: Not on file   family history includes Arthritis in her father; Dementia in her maternal grandmother; Hypertension in her maternal grandmother; Stroke in her maternal grandmother; Supraventricular tachycardia in her mother.  Medications: Current Outpatient Medications  Medication Sig Dispense Refill  . cholecalciferol (VITAMIN D) 1000 units tablet Take 1,000 Units by mouth daily.    Marland Kitchen. lisdexamfetamine (VYVANSE) 20 MG capsule Take 1 capsule (20 mg total) by mouth every morning. 30 capsule 0  . [START ON 05/27/2019] lisdexamfetamine (VYVANSE) 20 MG capsule Take 1 capsule (20 mg total) by mouth every morning. 30 capsule 0  . Vilazodone HCl (VIIBRYD) 20 MG TABS Take 1 tablet (20 mg total) by mouth daily. 30 tablet 2   No current facility-administered medications for this visit.    Allergies  Allergen Reactions  . Ativan [Lorazepam]     Strange reaction of amensia      Discussed warning signs or symptoms. Please see discharge instructions. Patient expresses understanding.

## 2019-06-06 ENCOUNTER — Telehealth: Payer: Self-pay | Admitting: Family Medicine

## 2019-06-06 NOTE — Telephone Encounter (Signed)
Received MRI report lumbar spine from [facility]. Date of service 06/04/2019  Results is included below.  Advise patient to schedule follow-up with me in near future to go over results.  If she can bring copy of MRI imaging on disc so that we can review the pictures together.  Main finding is degenerative disc disease and mild back arthritis at L1-L2.  Otherwise mild degenerative changes present.  MRI LUMBAR SPINE WO CONTRAST9/30/2020 [facility] Result Impression  IMPRESSION:   1. Degenerative spondylosis, greatest at L1-2. No disc extrusion or high-grade stenosis.  2. Partially-imaged ovarian cysts (left larger than right). The partially-imaged left ovarian cyst measures approximately 2.4 cm AP. Probably benign-appearing adnexal cyst of greater than 1 cm. For a late post-menopausal patient (>1yrs), recommend  follow-up ultrasound for further characterization Koleen Nimrod Paper - Sept 2013).  Electronically Signed by: Mali Holder  Result Narrative  MRI LUMBAR SPINE WITHOUT CONTRAST  INDICATION: Low back pain.  TECHNIQUE: Multiplanar, multisequence MR imaging of the lumbar spine without IV contrast.  COMPARISON: 06/17/2010.  FINDINGS: For the purposes of this dictation, it is assumed that the most caudal lumbar-type vertebra is L5, and that the most caudal full intervertebral disc is L5-S1.  Degenerative disc disease (DDD) and facet arthropathy (see below).  Vertebral alignment: Mild retrolisthesis of L1 on L2. Slight retrolisthesis of L2 on L3, and L3 on L4.  Marrow signal: Mixed type I and type II discogenic marrow signal changes at L1-L2. These marrow signal changes have developed since 06/17/2010.  Vertebral body heights: Normal.  Conus medullaris: Terminates at a normal level, and is normal in size, contour, and signal intensity.  Individual disc levels:   T12-L1 (sagittal images only): Normal.  L1-2: DDD, which has progressed since 06/17/2010. Moderate disc  height loss, but only mild osteophytosis. Mild bilateral facet arthrosis. Mild retrolisthesis of L1 on L2. No significant spinal canal stenosis. Mild bilateral neural foraminal stenosis. No  disc extrusion.  L2-3: Very mild degenerative changes. Slight retrolisthesis. No spinal canal or neural foraminal stenosis. No disc extrusion.  L3-4: Very mild degenerative changes. Slight retrolisthesis. Mild left neural foraminal stenosis. No spinal canal or significant right neural foraminal stenosis. No disc extrusion.  L4-5: Mild bilateral facet arthropathy. Minimal DDD. Mild left neural foraminal stenosis. No spinal canal or significant right neural foraminal stenosis. No disc extrusion.  L5-S1: Moderate left and mild right facet arthropathy. The intervertebral disc is maintained. No spinal canal stenosis. No significant neural foraminal stenosis. No disc extrusion.  Other: Partially-imaged ovarian cysts (left larger than right). The partially-imaged left ovarian cyst measures approximately 2.4 cm AP.

## 2019-06-06 NOTE — Telephone Encounter (Signed)
Recommendations left on vm -EH/RMA  

## 2019-06-11 ENCOUNTER — Encounter: Payer: Self-pay | Admitting: Family Medicine

## 2019-06-11 ENCOUNTER — Ambulatory Visit (INDEPENDENT_AMBULATORY_CARE_PROVIDER_SITE_OTHER): Payer: Worker's Compensation | Admitting: Family Medicine

## 2019-06-11 ENCOUNTER — Other Ambulatory Visit: Payer: Self-pay

## 2019-06-11 VITALS — BP 135/83 | HR 76 | Wt 158.0 lb

## 2019-06-11 DIAGNOSIS — N83202 Unspecified ovarian cyst, left side: Secondary | ICD-10-CM

## 2019-06-11 DIAGNOSIS — N83201 Unspecified ovarian cyst, right side: Secondary | ICD-10-CM | POA: Diagnosis not present

## 2019-06-11 DIAGNOSIS — M545 Low back pain, unspecified: Secondary | ICD-10-CM

## 2019-06-11 NOTE — Progress Notes (Signed)
Teresa Taylor is a 58 y.o. female who presents to [facility] today for follow-up low back pain.  Patient was seen in early July following a workplace injury resulting in back pain.  She had pain initially thought to be lumbosacral strain.  She had extensive trial of physical therapy which had resulted in some moderate to significant improvement however continued to have pain.  She had MRI in the interim which showed degenerative changes at L1-L2 (when counting from above) but otherwise was largely unremarkable.  She notes continued occasional back pain which she is managing with home exercise program at this point.  She denies any radiating pain weakness or numbness.  MRI did show incidental ovarian cyst.  She is still menstruating.  ROS:  As above  Exam:  BP 135/83   Pulse 76   Wt 158 lb (71.7 kg)   BMI 27.12 kg/m  Wt Readings from Last 5 Encounters:  06/11/19 158 lb (71.7 kg)  05/15/19 157 lb (71.2 kg)  04/03/19 155 lb (70.3 kg)  03/26/19 150 lb (68 kg)  03/06/19 154 lb (69.9 kg)   General: Well Developed, well nourished, and in no acute distress.  Neuro/Psych: Alert and oriented x3, extra-ocular muscles intact, able to move all 4 extremities, sensation grossly intact. Skin: Warm and dry, no rashes noted.  Respiratory: Not using accessory muscles, speaking in full sentences, trachea midline.  Cardiovascular: Pulses palpable, no extremity edema. Abdomen: Does not appear distended. MSK: L-spine: Nontender to midline normal lumbar motion.    Lab and Radiology Results No results found for this or any previous visit (from the past 72 hour(s)). No results found. Acute Interface, Incoming Rad Results - 06/05/2019  2:01 PM EDT MRI LUMBAR SPINE WITHOUT CONTRAST  INDICATION: Low back pain.  TECHNIQUE: Multiplanar, multisequence MR imaging of the lumbar spine without IV contrast.  COMPARISON: 06/17/2010.  FINDINGS: For the purposes  of this dictation, it is assumed that the most caudal lumbar-type vertebra is L5, and that the most caudal full intervertebral disc is L5-S1.  Degenerative disc disease (DDD) and facet arthropathy (see below).  Vertebral alignment: Mild retrolisthesis of L1 on L2. Slight retrolisthesis of L2 on L3, and L3 on L4.  Marrow signal: Mixed type I and type II discogenic marrow signal changes at L1-L2. These marrow signal changes have developed since 06/17/2010.  Vertebral body heights: Normal.  Conus medullaris: Terminates at a normal level, and is normal in size, contour, and signal intensity.  Individual disc levels:   T12-L1 (sagittal images only): Normal.  L1-2: DDD, which has progressed since 06/17/2010. Moderate disc height loss, but only mild osteophytosis. Mild bilateral facet arthrosis. Mild retrolisthesis of L1 on L2. No significant spinal canal stenosis. Mild bilateral neural foraminal stenosis. No  disc extrusion.  L2-3: Very mild degenerative changes. Slight retrolisthesis. No spinal canal or neural foraminal stenosis. No disc extrusion.  L3-4: Very mild degenerative changes. Slight retrolisthesis. Mild left neural foraminal stenosis. No spinal canal or significant right neural foraminal stenosis. No disc extrusion.  L4-5: Mild bilateral facet arthropathy. Minimal DDD. Mild left neural foraminal stenosis. No spinal canal or significant right neural foraminal stenosis. No disc extrusion.  L5-S1: Moderate left and mild right facet arthropathy. The intervertebral disc is maintained. No spinal canal stenosis. No significant neural foraminal stenosis. No disc extrusion.  Other: Partially-imaged ovarian cysts (left larger than right). The partially-imaged left ovarian cyst measures approximately 2.4 cm AP.   IMPRESSION:   1. Degenerative spondylosis,  greatest at L1-2. No disc extrusion or high-grade stenosis.  2. Partially-imaged ovarian cysts (left larger than right). The  partially-imaged left ovarian cyst measures approximately 2.4 cm AP. Probably benign-appearing adnexal cyst of greater than 1 cm. For a late post-menopausal patient (>77yrs), recommend  follow-up ultrasound for further characterization Vassie Moselle Paper - Sept 2013).  Electronically Signed by: [place] Holder  EXAM: LUMBAR SPINE - COMPLETE 4+ VIEW  COMPARISON:  Lumbar spine radiograph dated 02/19/2014 and 09/13/2011  FINDINGS: There are 6 non-rib-bearing lumbar type vertebral bodies. Minimal retrolisthesis of L2 on L3. Otherwise alignment is intact. Vertebral body heights are maintained. There is moderate disc space loss at L2-3 which has increased since the prior study, as well as mild spurring and endplate sclerosis at this level. Lower lumbar facet arthropathy. Nonobstructive bowel gas pattern. No acute abnormality in the visualized pelvis.  IMPRESSION: No acute osseous abnormality in the spine. Degenerative disc disease most pronounced at L2-3.   Electronically Signed   By: Emmaline Kluver M.D.   On: 04/04/2019 13:54  I personally (independently) visualized and performed the interpretation of the images attached in this note.    Assessment and Plan: 58 y.o. female with  Low back pain.  Mild continued pain.  Patient likely has received maximum medical improvement however she discontinued has some residual defect.  Plan to proceed with watchful waiting for 1 month.  If no further change in pain will finalize Worker's Comp. injury.  Patient will keep me updated.  Of note the discrepancy between the area of degenerative changes on x-ray and MRI arise from possible 6 lumbar vertebrae.  If we count from the top the degenerative changes seen on x-ray at L2-3 are the same as degenerative changes seen at L1-2 on MRI.  Incidentally noted ovarian cyst.  Plan for pelvic ultrasound to follow this issue up.  Refer back to OB/GYN.  OBGYN at [facility].   PDMP not reviewed  this encounter. Orders Placed This Encounter  Procedures  . US Transvaginal Non-OB    Standing Status:   Future    Standing Expiration Date:   08/10/2020    Order Specific Question:   Reason for Exam (SYMPTOM  OR DIAGNOSIS REQUIRED)    Answer:   eval ovarian cyst seen on MRI at [facility].    Order Specific Question:   Preferred imaging location?    Answer:   [facility]   No orders of the defined types were placed in this encounter.   Historical information moved to improve visibility of documentation.  Past Medical History:  Diagnosis Date  . Allergy    No past surgical history on file. Social History   Tobacco Use  . Smoking status: Never Smoker  . Smokeless tobacco: Never Used  Substance Use Topics  . Alcohol use: Not on file   family history includes Arthritis in her father; Dementia in her maternal grandmother; Hypertension in her maternal grandmother; Stroke in her maternal grandmother; Supraventricular tachycardia in her mother.  Medications: Current Outpatient Medications  Medication Sig Dispense Refill  . cholecalciferol (VITAMIN D) 1000 units tablet Take 1,000 Units by mouth daily.    Marland Kitchen lisdexamfetamine (VYVANSE) 20 MG capsule Take 1 capsule (20 mg total) by mouth every morning. 30 capsule 0  . lisdexamfetamine (VYVANSE) 20 MG capsule Take 1 capsule (20 mg total) by mouth every morning. 30 capsule 0  . Vilazodone HCl (VIIBRYD) 20 MG TABS Take 1 tablet (20 mg total) by mouth daily. 30 tablet  2   No current facility-administered medications for this visit.    Allergies  Allergen Reactions  . Ativan [Lorazepam]     Strange reaction of amensia      Discussed warning signs or symptoms. Please see discharge instructions. Patient expresses understanding.

## 2019-06-11 NOTE — Patient Instructions (Signed)
Thank you for coming in today. Continue home exercise program.  If pain worsens or changes next step would be injection. If all is well we will finalize documentation for Worker's Comp. Injury. We will proceed with pelvic ultrasound to evaluate ovarian cyst seen incidentally on MRI Recheck as needed.

## 2019-06-12 ENCOUNTER — Encounter: Payer: Self-pay | Admitting: Family Medicine

## 2019-06-18 ENCOUNTER — Other Ambulatory Visit: Payer: Self-pay | Admitting: Physician Assistant

## 2019-06-18 DIAGNOSIS — F329 Major depressive disorder, single episode, unspecified: Secondary | ICD-10-CM

## 2019-06-18 DIAGNOSIS — F411 Generalized anxiety disorder: Secondary | ICD-10-CM

## 2019-06-20 ENCOUNTER — Encounter: Payer: Self-pay | Admitting: Family Medicine

## 2019-07-15 ENCOUNTER — Encounter: Payer: Self-pay | Admitting: Family Medicine

## 2019-07-22 ENCOUNTER — Ambulatory Visit: Payer: PRIVATE HEALTH INSURANCE | Admitting: Sports Medicine

## 2019-07-26 ENCOUNTER — Ambulatory Visit: Payer: PRIVATE HEALTH INSURANCE | Admitting: Sports Medicine

## 2019-07-29 ENCOUNTER — Ambulatory Visit: Payer: PRIVATE HEALTH INSURANCE | Admitting: Sports Medicine

## 2019-07-31 ENCOUNTER — Encounter: Payer: Self-pay | Admitting: Sports Medicine

## 2019-07-31 ENCOUNTER — Ambulatory Visit (INDEPENDENT_AMBULATORY_CARE_PROVIDER_SITE_OTHER): Payer: Worker's Compensation | Admitting: Sports Medicine

## 2019-07-31 ENCOUNTER — Other Ambulatory Visit: Payer: Self-pay

## 2019-07-31 DIAGNOSIS — M51369 Other intervertebral disc degeneration, lumbar region without mention of lumbar back pain or lower extremity pain: Secondary | ICD-10-CM | POA: Insufficient documentation

## 2019-07-31 DIAGNOSIS — M5136 Other intervertebral disc degeneration, lumbar region: Secondary | ICD-10-CM | POA: Diagnosis not present

## 2019-07-31 NOTE — Assessment & Plan Note (Addendum)
DDD worst at L1-L2 but present at multiple levels, also has multilevel facet arthritis, there are likely multiple contributing pain generators here. Occasional spasms into the quadratus lumborum. This was a work-related injury. At this point she is not interested in pharmacologic or interventional management, and feels as though she can live with it. We are going to add some traction with physical therapy. I am totally happy to order a lumbar epidural should she desire, meloxicam should she desire her gabapentin. We discussed the natural history of this, she will take care with physical activity and return to see me as needed.

## 2019-07-31 NOTE — Progress Notes (Signed)
Subjective:    CC: Low back pain  HPI: Teresa Taylor is a pleasant 58 year old female, she is a Art gallery manager, after an injury at work she had pain in her right low back, she went 3 months of therapy, ultimately an MRI showed multilevel DDD worst at L1-L2, at this point she has learned to live with the discomfort, she desires a nonpharmacologic approach and is okay with living with the current degree of pain which is intermittent.  She really has no limitations at this time.  I reviewed the past medical history, family history, social history, surgical history, and allergies today and no changes were needed.  Please see the problem list section below in epic for further details.  Past Medical History: Past Medical History:  Diagnosis Date  . Allergy    Past Surgical History: No past surgical history on file. Social History: Social History   Socioeconomic History  . Marital status: Divorced    Spouse name: Not on file  . Number of children: Not on file  . Years of education: Not on file  . Highest education level: Not on file  Occupational History  . Not on file  Social Needs  . Financial resource strain: Not on file  . Food insecurity    Worry: Not on file    Inability: Not on file  . Transportation needs    Medical: Not on file    Non-medical: Not on file  Tobacco Use  . Smoking status: Never Smoker  . Smokeless tobacco: Never Used  Substance and Sexual Activity  . Alcohol use: Not on file  . Drug use: Not on file  . Sexual activity: Not on file  Lifestyle  . Physical activity    Days per week: Not on file    Minutes per session: Not on file  . Stress: Not on file  Relationships  . Social Herbalist on phone: Not on file    Gets together: Not on file    Attends religious service: Not on file    Active member of club or organization: Not on file    Attends meetings of clubs or organizations: Not on file    Relationship status: Not on file  Other Topics  Concern  . Not on file  Social History Narrative  . Not on file   Family History: Family History  Problem Relation Age of Onset  . Supraventricular tachycardia Mother   . Arthritis Father   . Hypertension Maternal Grandmother   . Stroke Maternal Grandmother   . Dementia Maternal Grandmother    Allergies: Allergies  Allergen Reactions  . Ativan [Lorazepam]     Strange reaction of amensia   Medications: See med rec.  Review of Systems: No fevers, chills, night sweats, weight loss, chest pain, or shortness of breath.   Objective:    General: Well Developed, well nourished, and in no acute distress.  Neuro: Alert and oriented x3, extra-ocular muscles intact, sensation grossly intact.  HEENT: Normocephalic, atraumatic, pupils equal round reactive to light, neck supple, no masses, no lymphadenopathy, thyroid nonpalpable.  Skin: Warm and dry, no rashes. Cardiac: Regular rate and rhythm, no murmurs rubs or gallops, no lower extremity edema.  Respiratory: Clear to auscultation bilaterally. Not using accessory muscles, speaking in full sentences.  Impression and Recommendations:    Lumbar degenerative disc disease DDD worst at L1-L2 but present at multiple levels, also has multilevel facet arthritis, there are likely multiple contributing pain generators here. Occasional spasms  into the quadratus lumborum. This was a work-related injury. At this point she is not interested in pharmacologic or interventional management, and feels as though she can live with it. We are going to add some traction with physical therapy. I am totally happy to order a lumbar epidural should she desire, meloxicam should she desire her gabapentin. We discussed the natural history of this, she will take care with physical activity and return to see me as needed.  I spent 25 minutes with this patient, greater than 50% was face-to-face time counseling regarding the above diagnoses.   ___________________________________________ Ihor Austin. Benjamin Stain, M.D., ABFM., CAQSM. Primary Care and Sports Medicine [facility]  Adjunct Professor of Family Medicine  [employer]

## 2019-08-12 ENCOUNTER — Ambulatory Visit: Payer: PRIVATE HEALTH INSURANCE | Admitting: Rehabilitative and Restorative Service Providers"

## 2019-08-15 ENCOUNTER — Other Ambulatory Visit: Payer: Self-pay

## 2019-08-15 ENCOUNTER — Ambulatory Visit (INDEPENDENT_AMBULATORY_CARE_PROVIDER_SITE_OTHER): Payer: Worker's Compensation | Admitting: Physical Therapy

## 2019-08-15 ENCOUNTER — Encounter: Payer: Self-pay | Admitting: Physical Therapy

## 2019-08-15 ENCOUNTER — Telehealth: Payer: Self-pay | Admitting: Sports Medicine

## 2019-08-15 ENCOUNTER — Other Ambulatory Visit: Payer: Self-pay | Admitting: Physician Assistant

## 2019-08-15 DIAGNOSIS — M545 Low back pain, unspecified: Secondary | ICD-10-CM

## 2019-08-15 DIAGNOSIS — R29898 Other symptoms and signs involving the musculoskeletal system: Secondary | ICD-10-CM

## 2019-08-15 DIAGNOSIS — F411 Generalized anxiety disorder: Secondary | ICD-10-CM

## 2019-08-15 DIAGNOSIS — F329 Major depressive disorder, single episode, unspecified: Secondary | ICD-10-CM

## 2019-08-15 NOTE — Telephone Encounter (Signed)
Jeani Hawking was calling from [facility] for patient FMLA. The number there was [phone]. Jeani Hawking was needing the most recent office note for office note from when she seen sports medicine. I let lynn know I would fax it to [phone]. I have faxed the notes and received confirmation.
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
## 2019-08-16 ENCOUNTER — Ambulatory Visit: Payer: PRIVATE HEALTH INSURANCE | Admitting: Physical Therapy

## 2019-08-16 ENCOUNTER — Telehealth: Payer: Self-pay | Admitting: Neurology

## 2019-08-16 NOTE — Telephone Encounter (Signed)
Patient left vm asking why Viibryd refill was denied.   Called patient back and LMOM letting her know 90 day supply was sent 06/18/2019, so she should have medication to last til January. She is to call back with any questions.

## 2019-08-22 ENCOUNTER — Other Ambulatory Visit: Payer: Self-pay

## 2019-08-22 ENCOUNTER — Ambulatory Visit (INDEPENDENT_AMBULATORY_CARE_PROVIDER_SITE_OTHER): Payer: Worker's Compensation | Admitting: Physical Therapy

## 2019-08-22 ENCOUNTER — Encounter: Payer: Self-pay | Admitting: Physical Therapy

## 2019-08-22 DIAGNOSIS — M545 Low back pain, unspecified: Secondary | ICD-10-CM

## 2019-08-22 DIAGNOSIS — R29898 Other symptoms and signs involving the musculoskeletal system: Secondary | ICD-10-CM
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
## 2019-08-27 ENCOUNTER — Ambulatory Visit (INDEPENDENT_AMBULATORY_CARE_PROVIDER_SITE_OTHER): Payer: Worker's Compensation | Admitting: Rehabilitative and Restorative Service Providers"

## 2019-08-27 ENCOUNTER — Encounter: Payer: Self-pay | Admitting: Rehabilitative and Restorative Service Providers"

## 2019-08-27 ENCOUNTER — Other Ambulatory Visit: Payer: Self-pay

## 2019-08-27 DIAGNOSIS — M545 Low back pain, unspecified: Secondary | ICD-10-CM

## 2019-08-27 DIAGNOSIS — R29898 Other symptoms and signs involving the musculoskeletal system: Secondary | ICD-10-CM | POA: Diagnosis not present
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
## 2019-08-27 NOTE — Patient Instructions (Signed)
Access Code: [email or web address]  URL: [email or web address]  Date: 08/27/2019  Prepared by: Rudell Cobb   Exercises Piriformis Mobilization on Foam Roll - 1 reps - 1 sets - 60 seconds hold - 2x daily - 7x weekly Standing Quadratus Lumborum Stretch with Doorway - 3 reps - 1 sets - 2x daily - 7x weekly

## 2019-09-02 ENCOUNTER — Other Ambulatory Visit: Payer: Self-pay

## 2019-09-02 ENCOUNTER — Ambulatory Visit (INDEPENDENT_AMBULATORY_CARE_PROVIDER_SITE_OTHER): Payer: Worker's Compensation | Admitting: Physical Therapy

## 2019-09-02 ENCOUNTER — Encounter: Payer: Self-pay | Admitting: Physical Therapy

## 2019-09-02 DIAGNOSIS — M545 Low back pain, unspecified: Secondary | ICD-10-CM

## 2019-09-02 DIAGNOSIS — R29898 Other symptoms and signs involving the musculoskeletal system: Secondary | ICD-10-CM | POA: Diagnosis not present
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
## 2019-09-09 ENCOUNTER — Ambulatory Visit (INDEPENDENT_AMBULATORY_CARE_PROVIDER_SITE_OTHER): Payer: PRIVATE HEALTH INSURANCE | Admitting: Physical Therapy

## 2019-09-09 ENCOUNTER — Other Ambulatory Visit: Payer: Self-pay

## 2019-09-09 DIAGNOSIS — R29898 Other symptoms and signs involving the musculoskeletal system: Secondary | ICD-10-CM | POA: Diagnosis not present

## 2019-09-09 DIAGNOSIS — M545 Low back pain, unspecified: Secondary | ICD-10-CM
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
## 2019-09-16 ENCOUNTER — Other Ambulatory Visit: Payer: Self-pay

## 2019-09-16 ENCOUNTER — Ambulatory Visit (INDEPENDENT_AMBULATORY_CARE_PROVIDER_SITE_OTHER): Payer: PRIVATE HEALTH INSURANCE | Admitting: Physician Assistant

## 2019-09-16 ENCOUNTER — Encounter: Payer: Self-pay | Admitting: Physician Assistant

## 2019-09-16 ENCOUNTER — Encounter: Payer: Self-pay | Admitting: Rehabilitative and Restorative Service Providers"

## 2019-09-16 ENCOUNTER — Ambulatory Visit (INDEPENDENT_AMBULATORY_CARE_PROVIDER_SITE_OTHER): Payer: Worker's Compensation | Admitting: Rehabilitative and Restorative Service Providers"

## 2019-09-16 VITALS — BP 117/81 | HR 78 | Temp 97.1°F | Ht 64.0 in | Wt 158.0 lb

## 2019-09-16 DIAGNOSIS — R29898 Other symptoms and signs involving the musculoskeletal system: Secondary | ICD-10-CM

## 2019-09-16 DIAGNOSIS — R42 Dizziness and giddiness: Secondary | ICD-10-CM

## 2019-09-16 DIAGNOSIS — M545 Low back pain, unspecified: Secondary | ICD-10-CM

## 2019-09-16 DIAGNOSIS — R55 Syncope and collapse: Secondary | ICD-10-CM | POA: Diagnosis not present

## 2019-09-16 NOTE — Progress Notes (Addendum)
Patient ID: Teresa Taylor, female   DOB: [birth year], 59 y.o.   MRN: [id number] .Virtual Visit via Video Note  I connected with Teresa Taylor on 09/16/2019 at  1:20 PM EST by a video enabled telemedicine application and verified that I am speaking with the correct person using two identifiers.  Location: Patient: home Provider: clinic   I discussed the limitations of evaluation and management by telemedicine and the availability of in person appointments. The patient expressed understanding and agreed to proceed.  History of Present Illness: Patient is a 59 year old female with history of tachycardia, ADHD, depression, anxiety, anemia who calls into the clinic to discuss to near syncope/dizziness episodes.  The first episode was Thursday approximately 5 days ago when she was at the grocery store.  She was just walking down the grocery aisle and she had an out of body experience.  She just felt like she was not there.  She kind of felt like tunnel vision or some dizziness.  That quickly resolved with no intervention.  She then had another episode Sunday approximately 1 day ago at church.  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  She was also nauseated and had to go to the bathroom.  She ended up having some diarrhea and sitting down for a while.  She also got a Coke and ate something and felt a lot better.  She never passed out or LOC. All other symptoms have resolved.  She does admit that she did not eat or drink on Sunday morning.  She denies any fever, chills, sinus pressure, ear pain, loss of smell or taste, cough, shortness of breath.  The only new medication she started was birth control about a month ago.   .. Active Ambulatory Problems    Diagnosis Date Noted  . TACHYCARDIA, PAROXYSMAL NOS 03/01/2007  . INSOMNIA 02/15/2008  . DOMESTIC ABUSE 02/06/2009  . ADHD (attention deficit hyperactivity disorder) 01/09/2013  . Abnormal weight gain 09/18/2015  .  Generalized anxiety disorder 10/26/2015  . Depression 10/27/2015  . Atypical chest pain 04/08/2016  . IDA (iron deficiency anemia) 04/08/2016  . Microcytic anemia 04/10/2016  . Overweight 05/09/2017  . Low energy 05/20/2018  . Excessive daytime sleepiness 05/20/2018  . Lumbar degenerative disc disease 07/31/2019  . Near syncope 09/17/2019  . Dizziness 09/17/2019   Resolved Ambulatory Problems    Diagnosis Date Noted  . Muscle spasm of back 02/21/2014  . Right low back pain 02/21/2014   Past Medical History:  Diagnosis Date  . Allergy    Reviewed med, allergy, problem list.     Observations/Objective: No acute distress.  Normal mood and appearance.   .. Today's Vitals   09/16/19 1028  BP: 117/81  Pulse: 78  Temp: (!) 97.1 F (36.2 C)  TempSrc: Oral  Weight: 158 lb (71.7 kg)  Height: 5\' 4"  (1.626 m)   Body mass index is 27.12 kg/m.    Assessment and Plan: Marland KitchenTammy was seen today for nausea.  Diagnoses and all orders for this visit:  Near syncope  Dizziness   Exact etiology of symptoms unclear.  Patient does have low blood pressure historically.  Think there could be some orthostatic hypotension, dehydration, low blood sugar combinations causing the symptoms.  Certainly if they keep it occurring need to look closer into this.  In the meantime suggested small frequent meals/snacks/drinking.  I do not think there is enough convincing evidence of Covid.  These were not typical symptoms of  Covid.  If she continues to have them certainly we could write her out of work until she gets Covid tested.  Spent 30 minutes with patient and chart review.    Follow Up Instructions:    I discussed the assessment and treatment plan with the patient. The patient was provided an opportunity to ask questions and all were answered. The patient agreed with the plan and demonstrated an understanding of the instructions.   The patient was advised to call back or seek an in-person  evaluation if the symptoms worsen or if the condition fails to improve as anticipated.     Iran Planas, PA-C

## 2019-09-16 NOTE — Therapy (Signed)
[place], [place], [address] Phone: [phone]   Fax:  [phone]  Physical Therapy Treatment  Patient Details  Name: Teresa Taylor MRN: [id number] Date of Birth: [birth year] Referring Provider (PT): Dr. Dianah Field   Encounter Date: 09/16/2019  PT End of Session - 09/16/19 1530    Visit Number  6    Number of Visits  8    Date for PT Re-Evaluation  09/26/19    Authorization Type  Workman's Comp    Authorization Time Period  8 visits    PT Start Time  8563    PT Stop Time  1625    PT Time Calculation (min)  55 min    Activity Tolerance  Patient tolerated treatment well;No increased pain    Behavior During Therapy  WFL for tasks assessed/performed       Past Medical History:  Diagnosis Date  . Allergy     History reviewed. No pertinent surgical history.  There were no vitals filed for this visit.  Subjective Assessment - 09/16/19 1526    Subjective  The patient reports she had 5 days without back pain, and then she had a grabbing pain on Saturday when she uncrossed her legs while sitting at a table.  She notes 1/10 currently that goes up to a 9/10 when it grabs (lasting for 5-10 seconds and then she gets soreness that lasts for 1-2 days).    Pertinent History  L2/3 degeration, arthritis, depression    Diagnostic tests  xray and mri    Patient Stated Goals  spinal realignment; to get rid of pain    Currently in Pain?  Yes    Pain Score  1     Pain Location  Back    Pain Orientation  Right;Lower    Pain Descriptors / Indicators  Aching;Hervey Ard   can have occasional shooting pain.   Pain Type  Chronic pain    Pain Onset  More than a month ago    Pain Frequency  Intermittent    Aggravating Factors   spontaneous    Pain Relieving Factors  some stretching and rest                       [facility] Adult PT Treatment/Exercise - 09/16/19 1534      Exercises   Exercises  Lumbar       Lumbar Exercises: Stretches   Double Knee to Chest Stretch  1 rep;30 seconds    Double Knee to Chest Stretch Limitations  used a towel roll under sacrum to increase flexion further    Other Lumbar Stretch Exercise  standing McKenzie lateral glide stretch (Rt shoulder against wall pushing hips under shoulders) x 10 sec x 2 reps    Other Lumbar Stretch Exercise  sidelying QL stretch with R LE off posterior aspect of mat and R UE reaching overhead to lengthen lateral trunk musculature      Lumbar Exercises: Prone   Other Prone Lumbar Exercises  prone on elbows with repeated extension       Lumbar Exercises: Quadruped   [place]/Old Horse  10 reps      Traction   Type of Traction  Lumbar    Min (lbs)  55    Max (lbs)  65    Hold Time  60    Rest Time  20    Time  15      Manual Therapy  Manual Therapy  Joint mobilization;Soft tissue mobilization    Joint Mobilization  grade II-III    Soft tissue mobilization  cross fiber friction massage of thoracic paraspinals, QL                  PT Long Term Goals - 08/22/19 1350      PT LONG TERM GOAL #1   Title  Decrease pain with patient to report 75% decrease incidence of back pain with reaching.    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    Time  6    Period  Weeks    Status  On-going      PT LONG TERM GOAL #5   Title  Patient to demo 5/5 bil hip strength with good lumbar stabilization.    Time  6    Period  Weeks    Status  On-going            Plan - 09/16/19 1615    Clinical Impression Statement  The patient reports longer periods of reduced pain with  occasional episodes of "grabbing" pain then muscle spasm that leads to soreness x 2 days.  These episodes are dec'ing in frequency and appear to occur during right side trunk shortening.  The patient has strategies to manage muscle soreness and spasm with home stretching and foam rolling/trigger point release.  She continues to feel improvement with traction.    PT Treatment/Interventions  ADLs/Self Care Home Management;Cryotherapy;Electrical Stimulation;Moist Heat;Traction;Ultrasound;Therapeutic activities;Therapeutic exercise;Neuromuscular re-education;Patient/family education;Manual techniques;Dry needling;Taping    PT Next Visit Plan  Assess traction and increase as tolerated (wt 158#); STW/MFR to right lumbar/QL, lumbar stabilization TE    PT Home Exercise Plan  supine QL stretch (reverse C); lateral mckenzie stretch for QL    Consulted and Agree with Plan of Care  Patient       Patient will benefit from skilled therapeutic intervention in order to improve the following deficits and impairments:  Decreased range of motion, Pain, Decreased activity tolerance, Impaired flexibility, Decreased strength, Hypomobility, Increased muscle spasms  Visit Diagnosis: Other symptoms and signs involving the musculoskeletal system  Acute right-sided low back pain without sciatica     Problem List Patient Active Problem List   Diagnosis Date Noted  . Lumbar degenerative disc disease 07/31/2019  . Low energy 05/20/2018  . Excessive daytime sleepiness 05/20/2018  . Overweight 05/09/2017  . Microcytic anemia 04/10/2016  . Atypical chest pain 04/08/2016  . IDA (iron deficiency anemia) 04/08/2016  . Depression 10/27/2015  . Generalized anxiety disorder 10/26/2015  . Abnormal weight gain 09/18/2015  . ADHD (attention deficit hyperactivity disorder) 01/09/2013  . DOMESTIC ABUSE 02/06/2009  . INSOMNIA 02/15/2008  . TACHYCARDIA, PAROXYSMAL NOS 03/01/2007    Sayla Golonka, PT 09/16/2019, 7:06  PM  [facility] [address] [place], [place], [address] Phone: [phone]   Fax:  [phone]  Name: Etana  Bunte MRN: [id number] Date of Birth: Dec 07, 1960

## 2019-09-16 NOTE — Progress Notes (Signed)
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 Didn't eat that morning or dinner the night before. She drank a coke and felt better. Has never had that happen before.

## 2019-09-17 ENCOUNTER — Telehealth: Payer: Self-pay | Admitting: Physician Assistant

## 2019-09-17 DIAGNOSIS — R55 Syncope and collapse: Secondary | ICD-10-CM | POA: Insufficient documentation

## 2019-09-17 DIAGNOSIS — R42 Dizziness and giddiness: Secondary | ICD-10-CM | POA: Insufficient documentation

## 2019-09-17 NOTE — Telephone Encounter (Signed)
mychart msg.  

## 2019-09-23 ENCOUNTER — Encounter: Payer: PRIVATE HEALTH INSURANCE | Admitting: Physical Therapy

## 2019-09-25 ENCOUNTER — Other Ambulatory Visit: Payer: Self-pay

## 2019-09-25 ENCOUNTER — Ambulatory Visit (INDEPENDENT_AMBULATORY_CARE_PROVIDER_SITE_OTHER): Payer: Worker's Compensation | Admitting: Physical Therapy

## 2019-09-25 DIAGNOSIS — M545 Low back pain, unspecified: Secondary | ICD-10-CM

## 2019-09-25 DIAGNOSIS — R29898 Other symptoms and signs involving the musculoskeletal system: Secondary | ICD-10-CM

## 2019-09-25 NOTE — Therapy (Signed)
[place], [place], [address] Phone: [phone]   Fax:  [phone]  Physical Therapy Treatment  Patient Details  Name: Teresa Taylor MRN: [id number] Date of Birth: 08/26/1961 Referring Provider (PT): Dr. Dianah Field   Encounter Date: 09/25/2019  PT End of Session - 09/25/19 1446    Visit Number  7    Number of Visits  8    Date for PT Re-Evaluation  09/26/19    Authorization Type  Workman's Comp    Authorization Time Period  8 visits    PT Start Time  0623   pt arrived late   PT Stop Time  1519    PT Time Calculation (min)  41 min    Activity Tolerance  Patient tolerated treatment well;No increased pain    Behavior During Therapy  WFL for tasks assessed/performed       Past Medical History:  Diagnosis Date  . Allergy     No past surgical history on file.  There were no vitals filed for this visit.  Subjective Assessment - 09/25/19 1440    Subjective  Pt reports 6 days relief from traction, and then on Saturday she uncrossed her leg and she had stabbing pain in Rt low back.  She has stretched prior to treatment.    Currently in Pain?  No/denies    Pain Score  0-No pain    Pain Location  Back    Pain Orientation  Right         [facility] PT Assessment - 09/25/19 0001      Assessment   Medical Diagnosis  Lumbar DDD    Referring Provider (PT)  Dr. Dianah Field    Onset Date/Surgical Date  03/06/19    Prior Therapy  yes; does not like DN      Strength   Right Hip Flexion  5/5    Right Hip ABduction  --   5-/5       [facility] Adult PT Treatment/Exercise - 09/25/19 0001      Lumbar Exercises: Quadruped   Other Quadruped Lumbar Exercises  childs pose x20 sec       Modalities   Modalities  Moist Heat;Traction      Moist Heat Therapy   Number Minutes Moist Heat  15 Minutes    Moist Heat Location  Lumbar Spine   during traction     Traction   Type of Traction  Lumbar    Min  (lbs)  55    Max (lbs)  65    Hold Time  60    Rest Time  20    Time  15      Manual Therapy   Soft tissue mobilization  cross fiber friction and TPR massage of Rt thoracic/lumbar paraspinals, QL.  IASTM to same area to decrease fascial restrictions.  TPR to Rt llumbar musculature with passive IR/ER of RLE with knee flexed.     Myofascial Release  Rt lumbar paraspinals.        PT Long Term Goals - 09/25/19 1443      PT LONG TERM GOAL #1   Title  Decrease pain with patient to report 75% decrease incidence of back pain with reaching.    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    Time  6    Period  Weeks    Status  Partially Met      PT LONG TERM GOAL #5   Title  Patient to demo 5/5 bil hip strength with good lumbar stabilization.    Time  6    Period  Weeks    Status  On-going            Plan - 09/25/19 1511    Clinical Impression Statement  Pt has met LTG #1 and 3.  LE strength improving with core engaged.  Some tenderness at Rt psoas origin; recommended self massage with ball to psoas for release work.  Pt continues to have positive response to traction; repeated today.    Rehab Potential  Excellent    PT Frequency  2x / week    PT Duration  6 weeks    PT Treatment/Interventions  ADLs/Self Care Home Management;Cryotherapy;Electrical Stimulation;Moist Heat;Traction;Ultrasound;Therapeutic activities;Therapeutic exercise;Neuromuscular re-education;Patient/family education;Manual techniques;Dry needling;Taping    PT Next Visit Plan  assess goals; MD note (end of POC)    Consulted and Agree with Plan of Care   Patient       Patient will benefit from skilled therapeutic intervention in order to improve the following deficits and impairments:  Decreased range of motion, Pain, Decreased activity tolerance, Impaired flexibility, Decreased strength, Hypomobility, Increased muscle spasms  Visit Diagnosis: Other symptoms and signs involving the musculoskeletal system  Acute right-sided low back pain without sciatica     Problem List Patient Active Problem List   Diagnosis Date Noted  . Near syncope 09/17/2019  . Dizziness 09/17/2019  . Lumbar degenerative disc disease 07/31/2019  . Low energy 05/20/2018  . Excessive daytime sleepiness 05/20/2018  . Overweight 05/09/2017  . Microcytic anemia 04/10/2016  . Atypical chest pain 04/08/2016  . IDA (iron deficiency anemia) 04/08/2016  . Depression 10/27/2015  . Generalized anxiety disorder 10/26/2015  . Abnormal weight gain 09/18/2015  . ADHD (attention deficit hyperactivity disorder) 01/09/2013  . DOMESTIC ABUSE 02/06/2009  . INSOMNIA 02/15/2008  . TACHYCARDIA, PAROXYSMAL NOS 03/01/2007   Kerin Perna, PTA 09/25/19 3:16 PM   [place], [place], [address] Phone: [phone]   Fax:  [phone]  Name: Cerise Lieber MRN: [id number] Date of Birth: 1961/09/01

## 2019-09-30 ENCOUNTER — Encounter: Payer: PRIVATE HEALTH INSURANCE | Admitting: Physical Therapy

## 2019-10-07 ENCOUNTER — Ambulatory Visit (INDEPENDENT_AMBULATORY_CARE_PROVIDER_SITE_OTHER): Payer: Worker's Compensation | Admitting: Rehabilitative and Restorative Service Providers"

## 2019-10-07 ENCOUNTER — Other Ambulatory Visit: Payer: Self-pay

## 2019-10-07 DIAGNOSIS — R29898 Other symptoms and signs involving the musculoskeletal system: Secondary | ICD-10-CM

## 2019-10-07 DIAGNOSIS — M545 Low back pain, unspecified: Secondary | ICD-10-CM

## 2019-10-07 NOTE — Patient Instructions (Signed)
Access Code: [email or web address]  URL: [email or web address]  Date: 10/07/2019  Prepared by: Margretta Ditty   Exercises Piriformis Mobilization on Foam Roll - 1 reps - 1 sets - 60 seconds hold - 2x daily - 7x weekly Standing Quadratus Lumborum Stretch with Doorway - 3 reps - 1 sets - 2x daily - 7x weekly Supine Thoracic Mobilization Towel Roll Horizontal - 1 reps - 1 sets - 15 minutes hold - 2x daily - 7x weekly

## 2019-10-07 NOTE — Therapy (Signed)
[place] [address] [place], [place], [address] Phone: [phone]   Fax:  [phone]  Physical Therapy Treatment and On-hold Note  Patient Details  Name: Teresa Taylor MRN: [id number] Date of Birth: 1961-01-19 Referring Provider (PT): Dr. Dianah Field   Encounter Date: 10/07/2019  PT End of Session - 10/07/19 1515    Visit Number  8    Number of Visits  8    Date for PT Re-Evaluation  09/26/19    Authorization Type  Workman's Comp    Authorization Time Period  8 visits    PT Start Time  1219    PT Stop Time  1600    PT Time Calculation (min)  45 min    Activity Tolerance  Patient tolerated treatment well;No increased pain    Behavior During Therapy  WFL for tasks assessed/performed       Past Medical History:  Diagnosis Date  . Allergy     No past surgical history on file.  There were no vitals filed for this visit.  Subjective Assessment - 10/07/19 1516    Subjective  The patient reports she had a week of relief after traction last visit on 09/25/19.  On Wednesday 10/02/19, she uncrossed her legs and got a muscle spasm around upper lumbar facet joint and felt pain into R glut.  She then feels once it spasms, you have ongoing tightness at the R facet joints that radiates into erector spinae and quadratus lumborum.  She is able to stretch surrounding muscles, but can't get into the tightness around the right facet joint.  She feels traction releases it temporarily, but no way to fix this long term so far.    Pertinent History  L2/3 degeration, arthritis, depression    Diagnostic tests  xray and mri    Patient Stated Goals  spinal realignment; to get rid of pain    Currently in Pain?  No/denies                       [facility] Adult PT Treatment/Exercise - 10/07/19 1540      Self-Care   Self-Care  Other Self-Care Comments    Other Self-Care Comments   PT and patient discussed current stretches she  uses for self mgmt once spasms/tightness occurs in R facet region.  Then discussed self massage with tennis ball and ways she has tried to get "home traction" without being able to get to deep spinal musculature.      Exercises   Exercises  Lumbar      Lumbar Exercises: Supine   Other Supine Lumbar Exercises  Foam roller for thoracic and lumbar spine perpendicular and parallel to the spine, attempted small amplitude lateral rolling on foam roller for paraspinal mobilization.    Other Supine Lumbar Exercises  Supine towel roll above sore segment with LEs supported on physioball and allowing for deep stretch of spinal stabilizers      Traction   Type of Traction  Lumbar    Min (lbs)  55    Max (lbs)  65    Hold Time  60    Rest Time  20    Time  17             PT Education - 10/07/19 1617    Education Details  HEP modification    Person(s) Educated  Patient    Methods  Explanation;Handout;Demonstration    Comprehension  Verbalized understanding;Returned demonstration  PT Long Term Goals - 10/07/19 1602      PT LONG TERM GOAL #1   Title  Decrease pain with patient to report 75% decrease incidence of back pain with reaching.    Baseline  Patient notes she was having > 2 spasms per day and now has had one spasms in a week.    Time  6    Period  Weeks    Status  Achieved      PT LONG TERM GOAL #2   Title  Decrease palpable tightness Rt lumbar musculature to WNLs    Baseline  tightness today due to spasm last week and not feeling she can get back to baseline until she has lumbar traction.    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    Time  6    Period  Weeks    Status  Partially Met      PT LONG TERM GOAL #5   Title  Patient to demo  5/5 bil hip strength with good lumbar stabilization.    Baseline  Not retested today.    Time  6    Period  Weeks    Status  Deferred            Plan - 10/07/19 1604    Clinical Impression Statement  The patient has partially met LTGs.  She has had a significant decrease in frequency of R low back muscle spasms,however she still gets them about one week post traction.  She then develops surrounding muscle tightness from guarding that takes days to reduce and deep facet region tightness that doesn't improve until she undergoes traction again.  PT focused on ways to get deep paraspinal muscle stretch using towel rolls to position.  The patient has HEP and participates in regular exercise that improves her outcome.  PT to place on  hold as this was visit 8 of 8 approved through worker's comp.  She plans to return to Dr. Darene Lamer for reassessment.    Rehab Potential  Excellent    PT Frequency  2x / week    PT Duration  6 weeks    PT Treatment/Interventions  ADLs/Self Care Home Management;Cryotherapy;Electrical Stimulation;Moist Heat;Traction;Ultrasound;Therapeutic activities;Therapeutic exercise;Neuromuscular re-education;Patient/family education;Manual techniques;Dry needling;Taping    PT Next Visit Plan  On hold at this time.  Will return to MD.    Consulted and Agree with Plan of Care  Patient       Patient will benefit from skilled therapeutic intervention in order to improve the following deficits and impairments:  Decreased range of motion, Pain, Decreased activity tolerance, Impaired flexibility, Decreased strength, Hypomobility, Increased muscle spasms  Visit Diagnosis: Acute right-sided low back pain without sciatica  Other symptoms and signs involving the musculoskeletal system     Problem List Patient Active Problem List   Diagnosis Date Noted  . Near syncope 09/17/2019  . Dizziness 09/17/2019  . Lumbar degenerative disc disease 07/31/2019  . Low energy 05/20/2018  .  Excessive daytime sleepiness 05/20/2018  . Overweight 05/09/2017  . Microcytic anemia 04/10/2016  . Atypical chest pain 04/08/2016  .  IDA (iron deficiency anemia) 04/08/2016  . Depression 10/27/2015  . Generalized anxiety disorder 10/26/2015  . Abnormal weight gain 09/18/2015  . ADHD (attention deficit hyperactivity disorder) 01/09/2013  . DOMESTIC ABUSE 02/06/2009  . INSOMNIA 02/15/2008  . TACHYCARDIA, PAROXYSMAL NOS 03/01/2007    Eleva , PT 10/07/2019, 4:17 PM  [facility] [place], [place], [address] Phone: [phone]   Fax:  [phone]  Name: Teresa Taylor MRN: [id number] Date of Birth: 1960/12/01

## 2019-10-14 ENCOUNTER — Ambulatory Visit (INDEPENDENT_AMBULATORY_CARE_PROVIDER_SITE_OTHER): Payer: Worker's Compensation | Admitting: Sports Medicine

## 2019-10-14 ENCOUNTER — Other Ambulatory Visit: Payer: Self-pay

## 2019-10-14 ENCOUNTER — Encounter: Payer: Self-pay | Admitting: Sports Medicine

## 2019-10-14 DIAGNOSIS — M5136 Other intervertebral disc degeneration, lumbar region: Secondary | ICD-10-CM

## 2019-10-14 DIAGNOSIS — M51369 Other intervertebral disc degeneration, lumbar region without mention of lumbar back pain or lower extremity pain: Secondary | ICD-10-CM

## 2019-10-14 MED ORDER — AMBULATORY NON FORMULARY MEDICATION: 0 refills | Status: DC

## 2019-10-14 MED ORDER — MELOXICAM 15 MG PO TABS: ORAL_TABLET | ORAL | 3 refills | Status: DC

## 2019-10-14 NOTE — Assessment & Plan Note (Signed)
Teresa Taylor returns, she is a workers comp patient. She has an MRI that shows DDD worst at L1-L2 but present at multiple levels with multilevel facet arthritis. She has occasional radiation into the quadratus lumborum. She is not interested in pharmacologic or interventional management with the exception of switching from ibuprofen to meloxicam. Occasional traction is helpful, I am also going to write her a prescription for an inversion table and additional physical therapy. This is likely a lifelong process, and we discussed the limited nature and inability to cure this process. She will discuss longer care with her Worker's Comp. representatives.

## 2019-10-14 NOTE — Progress Notes (Signed)
    Procedures performed today:    None.  Independent interpretation of tests performed by another provider:   None.  Impression and Recommendations:    Lumbar degenerative disc disease Ebony returns, she is a workers comp patient. She has an MRI that shows DDD worst at L1-L2 but present at multiple levels with multilevel facet arthritis. She has occasional radiation into the quadratus lumborum. She is not interested in pharmacologic or interventional management with the exception of switching from ibuprofen to meloxicam. Occasional traction is helpful, I am also going to write her a prescription for an inversion table and additional physical therapy. This is likely a lifelong process, and we discussed the limited nature and inability to cure this process. She will discuss longer care with her Worker's Comp. representatives.    ___________________________________________ Ihor Austin. Benjamin Stain, M.D., ABFM., CAQSM. Primary Care and Sports Medicine [facility]  Adjunct Instructor of Family Medicine  [employer]

## 2019-11-04 ENCOUNTER — Other Ambulatory Visit: Payer: Self-pay

## 2019-11-04 ENCOUNTER — Ambulatory Visit (INDEPENDENT_AMBULATORY_CARE_PROVIDER_SITE_OTHER): Payer: Worker's Compensation | Admitting: Rehabilitative and Restorative Service Providers"

## 2019-11-04 DIAGNOSIS — R29898 Other symptoms and signs involving the musculoskeletal system: Secondary | ICD-10-CM

## 2019-11-04 DIAGNOSIS — M545 Low back pain, unspecified: Secondary | ICD-10-CM
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
## 2019-11-18 ENCOUNTER — Ambulatory Visit (INDEPENDENT_AMBULATORY_CARE_PROVIDER_SITE_OTHER): Payer: Worker's Compensation | Admitting: Rehabilitative and Restorative Service Providers"

## 2019-11-18 ENCOUNTER — Other Ambulatory Visit: Payer: Self-pay

## 2019-11-18 ENCOUNTER — Encounter: Payer: Self-pay | Admitting: Rehabilitative and Restorative Service Providers"

## 2019-11-18 DIAGNOSIS — R29898 Other symptoms and signs involving the musculoskeletal system: Secondary | ICD-10-CM | POA: Diagnosis not present

## 2019-11-18 DIAGNOSIS — M545 Low back pain, unspecified: Secondary | ICD-10-CM
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
## 2019-12-02 ENCOUNTER — Encounter: Payer: PRIVATE HEALTH INSURANCE | Admitting: Rehabilitative and Restorative Service Providers"

## 2019-12-09 ENCOUNTER — Encounter: Payer: Worker's Compensation | Admitting: Rehabilitative and Restorative Service Providers"

## 2019-12-16 ENCOUNTER — Encounter: Payer: PRIVATE HEALTH INSURANCE | Admitting: Rehabilitative and Restorative Service Providers"

## 2019-12-23 LAB — HM PAP SMEAR: HPV, high-risk: NEGATIVE

## 2019-12-24 ENCOUNTER — Encounter: Payer: Self-pay | Admitting: Physician Assistant

## 2019-12-30 ENCOUNTER — Other Ambulatory Visit: Payer: Self-pay

## 2019-12-30 ENCOUNTER — Ambulatory Visit (INDEPENDENT_AMBULATORY_CARE_PROVIDER_SITE_OTHER): Payer: Worker's Compensation | Admitting: Rehabilitative and Restorative Service Providers"

## 2019-12-30 ENCOUNTER — Encounter: Payer: PRIVATE HEALTH INSURANCE | Admitting: Rehabilitative and Restorative Service Providers"

## 2019-12-30 DIAGNOSIS — R29898 Other symptoms and signs involving the musculoskeletal system: Secondary | ICD-10-CM | POA: Diagnosis not present

## 2019-12-30 DIAGNOSIS — M545 Low back pain, unspecified: Secondary | ICD-10-CM
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
## 2019-12-30 NOTE — Patient Instructions (Signed)
Access Code: [email or web address] URL: [email or web address] Date: 12/30/2019 Prepared by: Margretta Ditty  Exercises Piriformis Mobilization on Foam Roll - 2 x daily - 7 x weekly - 1 reps - 1 sets - 60 seconds hold Standing Quadratus Lumborum Stretch with Doorway - 2 x daily - 7 x weekly - 3 reps - 1 sets Supine Thoracic Mobilization Towel Roll Horizontal - 2 x daily - 7 x weekly - 1 reps - 1 sets - 15 minutes hold sidelying low back stretch - 2 x daily - 7 x weekly - 1 sets - 2 reps - 30 seconds hold Thomas Stretch on Table - 2 x daily - 7 x weekly - 1 sets - 2 reps - 30 seconds hold Prone Quadriceps Stretch with Strap - 2 x daily - 7 x weekly - 1 sets - 3 reps - 30 seconds hold

## 2020-01-13 ENCOUNTER — Encounter: Payer: PRIVATE HEALTH INSURANCE | Admitting: Rehabilitative and Restorative Service Providers"

## 2020-01-20 ENCOUNTER — Other Ambulatory Visit: Payer: Self-pay

## 2020-01-20 ENCOUNTER — Ambulatory Visit (INDEPENDENT_AMBULATORY_CARE_PROVIDER_SITE_OTHER): Payer: Worker's Compensation | Admitting: Rehabilitative and Restorative Service Providers"

## 2020-01-20 ENCOUNTER — Encounter: Payer: Self-pay | Admitting: Rehabilitative and Restorative Service Providers"

## 2020-01-20 DIAGNOSIS — M545 Low back pain, unspecified: Secondary | ICD-10-CM

## 2020-01-20 DIAGNOSIS — R29898 Other symptoms and signs involving the musculoskeletal system: Secondary | ICD-10-CM | POA: Diagnosis not present
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
## 2020-01-27 ENCOUNTER — Encounter: Payer: PRIVATE HEALTH INSURANCE | Admitting: Rehabilitative and Restorative Service Providers"

## 2020-01-28 LAB — HM MAMMOGRAPHY

## 2020-02-10 ENCOUNTER — Encounter: Payer: PRIVATE HEALTH INSURANCE | Admitting: Rehabilitative and Restorative Service Providers"

## 2020-02-13 ENCOUNTER — Ambulatory Visit (INDEPENDENT_AMBULATORY_CARE_PROVIDER_SITE_OTHER): Payer: Worker's Compensation | Admitting: Physical Therapy

## 2020-02-13 ENCOUNTER — Other Ambulatory Visit: Payer: Self-pay

## 2020-02-13 ENCOUNTER — Encounter: Payer: Self-pay | Admitting: Physical Therapy

## 2020-02-13 DIAGNOSIS — M545 Low back pain, unspecified: Secondary | ICD-10-CM

## 2020-02-13 DIAGNOSIS — R29898 Other symptoms and signs involving the musculoskeletal system: Secondary | ICD-10-CM
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
## 2020-03-02 ENCOUNTER — Telehealth (INDEPENDENT_AMBULATORY_CARE_PROVIDER_SITE_OTHER): Payer: PRIVATE HEALTH INSURANCE | Admitting: Physician Assistant

## 2020-03-02 ENCOUNTER — Other Ambulatory Visit: Payer: Self-pay

## 2020-03-02 VITALS — Temp 97.1°F | Ht 64.0 in | Wt 158.0 lb

## 2020-03-02 DIAGNOSIS — F411 Generalized anxiety disorder: Secondary | ICD-10-CM | POA: Diagnosis not present

## 2020-03-02 DIAGNOSIS — Z1159 Encounter for screening for other viral diseases: Secondary | ICD-10-CM

## 2020-03-02 DIAGNOSIS — E663 Overweight: Secondary | ICD-10-CM

## 2020-03-02 DIAGNOSIS — M5136 Other intervertebral disc degeneration, lumbar region: Secondary | ICD-10-CM

## 2020-03-02 DIAGNOSIS — Z1211 Encounter for screening for malignant neoplasm of colon: Secondary | ICD-10-CM

## 2020-03-02 DIAGNOSIS — R635 Abnormal weight gain: Secondary | ICD-10-CM

## 2020-03-02 DIAGNOSIS — R7309 Other abnormal glucose: Secondary | ICD-10-CM

## 2020-03-02 DIAGNOSIS — F3341 Major depressive disorder, recurrent, in partial remission: Secondary | ICD-10-CM

## 2020-03-02 DIAGNOSIS — Z13220 Encounter for screening for lipoid disorders: Secondary | ICD-10-CM

## 2020-03-02 DIAGNOSIS — M51369 Other intervertebral disc degeneration, lumbar region without mention of lumbar back pain or lower extremity pain: Secondary | ICD-10-CM

## 2020-03-02 MED ORDER — VIIBRYD 20 MG PO TABS: 1.0000 | ORAL_TABLET | Freq: Every day | ORAL | 3 refills | Status: DC

## 2020-03-02 NOTE — Progress Notes (Signed)
Patient will check BP and give to provider at visit  Concerned about inability to lose weight, feels like she is stuck Tired all the time Needs refill Viibryd Stopped birth control but still having periods so she is going to restart PHQ9-GAD7 completed.   Pap physician for womans
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
## 2020-03-03 ENCOUNTER — Telehealth: Payer: Self-pay | Admitting: Neurology

## 2020-03-03 ENCOUNTER — Encounter: Payer: Self-pay | Admitting: Physician Assistant

## 2020-03-03 NOTE — Telephone Encounter (Signed)
Called patient to see if she would be okay with Cologuard order. She states she does not want to do that right now. Will let us know when she is ready.

## 2020-03-04 NOTE — Progress Notes (Signed)
Ashtan,   Microcytic anemia-usually per iron deficiency anemia. I will get nurse to add ferritin, iron panel, TIBC to better evaluate. I know iron is in your multivitamin but do you take any more?   Thyroid stable and good.  Cholesterol is good. HDL went down some and we want that as high as we can get it. Perhaps due to the decrease in activity this year due to back pain.  No diabetes sugar is GREAT.  Kidney, liver, glucose look great.   No overt reason for weight gain. I can try to get saxenda approved for weight but will be up to your insurance company. That is the daily injectable for weight loss. Are you on board with that?

## 2020-03-05 ENCOUNTER — Other Ambulatory Visit: Payer: Self-pay

## 2020-03-05 ENCOUNTER — Encounter: Payer: Self-pay | Admitting: Physician Assistant

## 2020-03-05 ENCOUNTER — Ambulatory Visit (INDEPENDENT_AMBULATORY_CARE_PROVIDER_SITE_OTHER): Payer: Worker's Compensation | Admitting: Rehabilitative and Restorative Service Providers"

## 2020-03-05 DIAGNOSIS — R29898 Other symptoms and signs involving the musculoskeletal system: Secondary | ICD-10-CM | POA: Diagnosis not present

## 2020-03-05 DIAGNOSIS — M545 Low back pain, unspecified: Secondary | ICD-10-CM

## 2020-03-05 NOTE — Therapy (Addendum)
[facility] [address] [place], [place], [address] Phone: [phone]   Fax:  [phone]  Physical Therapy Treatment and Discharge Summary  Patient Details  Name: Teresa Taylor MRN: [id number] Date of Birth: Aug 13, 1961 Referring Provider (PT): Dr. Dianah Field   Encounter Date: 03/05/2020   PT End of Session - 03/05/20 1656    Visit Number 14    Number of Visits 14    Date for PT Re-Evaluation 03/20/20    Authorization Type Workman's Comp    Authorization Time Period 8 visits used, 6 more visits approved    Authorization - Visit Number 6    Authorization - Number of Visits 6    PT Start Time 8329    PT Stop Time 1500    PT Time Calculation (min) 35 min    Activity Tolerance Patient tolerated treatment well;No increased pain    Behavior During Therapy WFL for tasks assessed/performed           Past Medical History:  Diagnosis Date  . Allergy     No past surgical history on file.  There were no vitals filed for this visit.   Subjective Assessment - 03/05/20 1625    Subjective "After I leave here, I will have a few good days".  She reports ongoing pain in R low back with a dull ache in top of her hip.  She is no longer getting stabbing pain in her back.  She also reports she has put on weight over the past few years.  She is increasing her walking routine due to getting a puppy.    Pertinent History L2/3 degeration, arthritis, depression    Patient Stated Goals spinal realignment; to get rid of pain    Currently in Pain? No/denies                             [facility] Adult PT Treatment/Exercise - 03/05/20 1633      Self-Care   Self-Care Other Self-Care Comments    Other Self-Care Comments  Pt is exercising with yoga and pilates.   She is still doing restorative yoga.  She reports workers comp will pay for inversion table and asked for resources on inversion tables.  The patient gets relief with  lumbar traction.  We discussed the option of a home Sanders lumbar traction maching (found online for $369).    PT showed patient online option and plans to submit information to Worker's Comp since we know she gets relief with traction.  This would be similar cost to an inversion table and this has been consistently effective for the patient.       Traction   Type of Traction Lumbar    Min (lbs) 55    Max (lbs) 65    Hold Time 60    Rest Time 20    Time 15                  PT Education - 03/05/20 1655    Education Details discussed home inversion units; post d/c plan    Person(s) Educated Patient    Methods Explanation    Comprehension Verbalized understanding            PT Short Term Goals - 03/05/20 1657      PT SHORT TERM GOAL #1   Title The patient will return demo home stretching and core stabilization initial HEP.    Time  6    Period Weeks    Status Achieved    Target Date 12/16/19      PT SHORT TERM GOAL #2   Title The patient will report less incidence of muscle spasms in R low back to < or equal to 1 in a 4 week period.    Baseline Has worsened in the past 2 months.  We discussed other changes to routine.  Patient got a new puppy and is doing increased bending, walking with dog.  She also is not taking as much time to stretch in the morning.    Time 6    Period Weeks    Status Not Met    Target Date 12/16/19             PT Long Term Goals - 03/05/20 1658      PT LONG TERM GOAL #1   Title The patient will return demo home stretching to relieve acute pain after muscle spasm in right low thoracic spine.    Time 12    Period Weeks    Status Achieved      PT LONG TERM GOAL #2   Title The patient will report no episodes in R low back muscle spasm over a 6 week period.    Baseline The patient is no longer having stabbing pain with spasms, however, she gets deep L QL achiness.    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    Time 8    Period Weeks    Status Partially Met      PT LONG TERM GOAL #4   Title The patient will be able to perform R sidebending lumbar spine without pain.    Time 12    Period Weeks    Status Achieved                 Plan - 03/05/20 1659    Clinical Impression Statement The patient returns to PT with dec'd sharp, shooting pain in R low back, but increased frequency of dull, aching pain in R QL region.  She gets consistent relief with traction, however specific movements can increase pain.  She has HEP for post d/c activities and is active with yoga, pilates, and strength training.  She plans to continue HEP and self mgmt of ongoing symptoms.    Rehab Potential Good    PT Frequency 1x / week    PT Duration 4 weeks    PT Treatment/Interventions ADLs/Self Care Home Management;Cryotherapy;Electrical Stimulation;Moist Heat;Traction;Ultrasound;Therapeutic activities;Therapeutic exercise;Neuromuscular re-education;Patient/family education;Manual techniques;Dry needling;Taping    PT Next Visit Plan discharge today    Consulted and Agree with Plan of Care Patient           Patient will benefit from skilled therapeutic intervention in order to improve the following deficits and impairments:  Decreased range of motion, Pain, Decreased activity tolerance, Impaired flexibility, Decreased strength, Hypomobility, Increased muscle spasms  Visit Diagnosis: Acute right-sided low back pain without sciatica  Other symptoms and signs involving the musculoskeletal system     Problem List Patient Active Problem List   Diagnosis Date Noted  . Near syncope 09/17/2019  . Dizziness 09/17/2019  . Lumbar  degenerative disc disease 07/31/2019  . Low energy 05/20/2018  . Excessive  daytime sleepiness 05/20/2018  . Overweight 05/09/2017  . Microcytic anemia 04/10/2016  . Atypical chest pain 04/08/2016  . IDA (iron deficiency anemia) 04/08/2016  . Depression 10/27/2015  . Generalized anxiety disorder 10/26/2015  . Abnormal weight gain 09/18/2015  . ADHD (attention deficit hyperactivity disorder) 01/09/2013  . DOMESTIC ABUSE 02/06/2009  . INSOMNIA 02/15/2008  . TACHYCARDIA, PAROXYSMAL NOS 03/01/2007   PHYSICAL THERAPY DISCHARGE SUMMARY  Visits from Start of Care: 14  Current functional level related to goals / functional outcomes: See goals above   Remaining deficits: Patient continues with intermittent episodes/ flare ups of Right QL pain   Education / Equipment: Home program, home mgmt of symptoms.  Plan: Patient agrees to discharge.  Patient goals were partially met. Patient is being discharged due to meeting the stated rehab goals.  ?????         Thank you for the referral of this patient. Rudell Cobb, MPT  [place] , [place] 03/05/2020, 5:53 PM  [facility] [address] [place], [place], [address] Phone: [phone]   Fax:  [phone]  Name: Teresa Taylor MRN: [id number] Date of Birth: Apr 08, 1961

## 2020-03-06 NOTE — Progress Notes (Signed)
Iron stores low normal to suggest iron def anemia.

## 2020-03-07 LAB — HEMOGLOBIN A1C
Hgb A1c MFr Bld: 4.9 % of total Hgb (ref <5.7)
Mean Plasma Glucose: 94 (calc)
eAG (mmol/L): 5.2 (calc)

## 2020-03-07 LAB — COMPLETE METABOLIC PANEL WITH GFR
AG Ratio: 1.4 (calc) (ref 1.0–2.5)
ALT: 15 U/L (ref 6–29)
AST: 19 U/L (ref 10–35)
Albumin: 4.2 g/dL (ref 3.6–5.1)
Alkaline phosphatase (APISO): 89 U/L (ref 37–153)
BUN: 18 mg/dL (ref 7–25)
CO2: 29 mmol/L (ref 20–32)
Calcium: 9.3 mg/dL (ref 8.6–10.4)
Chloride: 106 mmol/L (ref 98–110)
Creat: 0.94 mg/dL (ref 0.50–1.05)
GFR, Est African American: 78 mL/min/{1.73_m2} (ref >=60)
GFR, Est Non African American: 67 mL/min/{1.73_m2} (ref >=60)
Globulin: 2.9 g/dL (calc) (ref 1.9–3.7)
Glucose, Bld: 90 mg/dL (ref 65–99)
Potassium: 4.5 mmol/L (ref 3.5–5.3)
Sodium: 140 mmol/L (ref 135–146)
Total Bilirubin: 0.6 mg/dL (ref 0.2–1.2)
Total Protein: 7.1 g/dL (ref 6.1–8.1)

## 2020-03-07 LAB — CBC
HCT: 35.8 % (ref 35.0–45.0)
Hemoglobin: 11.1 g/dL — ABNORMAL LOW (ref 11.7–15.5)
MCH: 21.3 pg — ABNORMAL LOW (ref 27.0–33.0)
MCHC: 31 g/dL — ABNORMAL LOW (ref 32.0–36.0)
MCV: 68.6 fL — ABNORMAL LOW (ref 80.0–100.0)
MPV: 11.8 fL (ref 7.5–12.5)
Platelets: 249 10*3/uL (ref 140–400)
RBC: 5.22 10*6/uL — ABNORMAL HIGH (ref 3.80–5.10)
RDW: 15.2 % — ABNORMAL HIGH (ref 11.0–15.0)
WBC: 8.2 10*3/uL (ref 3.8–10.8)

## 2020-03-07 LAB — HEPATITIS C ANTIBODY
Hepatitis C Ab: NONREACTIVE
SIGNAL TO CUT-OFF: 0.04 (ref <1.00)

## 2020-03-07 LAB — LIPID PANEL W/REFLEX DIRECT LDL
Cholesterol: 168 mg/dL (ref <200)
HDL: 49 mg/dL — ABNORMAL LOW (ref >=50)
LDL Cholesterol (Calc): 98 mg/dL (calc)
Non-HDL Cholesterol (Calc): 119 mg/dL (calc) (ref <130)
Total CHOL/HDL Ratio: 3.4 (calc) (ref <5.0)
Triglycerides: 118 mg/dL (ref <150)

## 2020-03-07 LAB — INSULIN, FREE (BIOACTIVE): Insulin, Free: 9.5 u[IU]/mL (ref 1.5–14.9)

## 2020-03-07 LAB — IRON,TIBC AND FERRITIN PANEL
%SAT: 37 % (calc) (ref 16–45)
Ferritin: 28 ng/mL (ref 16–232)
Iron: 118 ug/dL (ref 45–160)
TIBC: 322 mcg/dL (calc) (ref 250–450)

## 2020-03-07 LAB — TSH: TSH: 2.18 mIU/L (ref 0.40–4.50)

## 2020-03-10 ENCOUNTER — Encounter: Payer: Self-pay | Admitting: Neurology

## 2020-03-10 NOTE — Progress Notes (Signed)
Allis,   Insulin free levels also in normal range.

## 2020-03-26 ENCOUNTER — Other Ambulatory Visit: Payer: Self-pay | Admitting: Sports Medicine

## 2020-03-26 DIAGNOSIS — M5136 Other intervertebral disc degeneration, lumbar region: Secondary | ICD-10-CM

## 2020-04-06 ENCOUNTER — Encounter: Payer: Self-pay | Admitting: Physician Assistant

## 2020-04-07 NOTE — Telephone Encounter (Signed)
Printed and scanned into patients chart

## 2020-04-22 NOTE — Telephone Encounter (Signed)
I sent PA for Viibryd through cover my meds and received authorization. I will fax determination letter to pharmacy once it is received. - CF

## 2020-06-02 ENCOUNTER — Ambulatory Visit (INDEPENDENT_AMBULATORY_CARE_PROVIDER_SITE_OTHER): Payer: BC Managed Care – PPO | Admitting: Physician Assistant

## 2020-06-02 ENCOUNTER — Encounter: Payer: Self-pay | Admitting: Physician Assistant

## 2020-06-02 VITALS — BP 134/71 | HR 74 | Ht 64.0 in | Wt 151.0 lb

## 2020-06-02 DIAGNOSIS — E559 Vitamin D deficiency, unspecified: Secondary | ICD-10-CM

## 2020-06-02 DIAGNOSIS — Z0000 Encounter for general adult medical examination without abnormal findings: Secondary | ICD-10-CM | POA: Diagnosis not present

## 2020-06-02 DIAGNOSIS — F488 Other specified nonpsychotic mental disorders: Secondary | ICD-10-CM

## 2020-06-02 DIAGNOSIS — M5136 Other intervertebral disc degeneration, lumbar region: Secondary | ICD-10-CM

## 2020-06-02 DIAGNOSIS — R4189 Other symptoms and signs involving cognitive functions and awareness: Secondary | ICD-10-CM

## 2020-06-02 DIAGNOSIS — N951 Menopausal and female climacteric states: Secondary | ICD-10-CM

## 2020-06-02 DIAGNOSIS — Z1211 Encounter for screening for malignant neoplasm of colon: Secondary | ICD-10-CM

## 2020-06-02 MED ORDER — VITAMIN D (ERGOCALCIFEROL) 1.25 MG (50000 UNIT) PO CAPS: 50000.0000 [IU] | ORAL_CAPSULE | ORAL | 3 refills | Status: DC

## 2020-06-02 NOTE — Progress Notes (Signed)
Subjective:     Teresa Taylor is a 59 y.o. female and is here for a comprehensive physical exam. The patient reports problems - see below.    Pt did stop OCP and she has not had a period since July. She is starting to have some hot flashes/mood changes and sleep issues.   She c/o brain fog for that last 2 weeks that has been bad. She is not taking vyvanse. She is exercising. She denies any fever, chills, body aches, cough, SOB.   She does have some lumbar DDD and insurance will pay for retraction device she wants to know which one.     Social History   Socioeconomic History   Marital status: Divorced    Spouse name: Not on file   Number of children: Not on file   Years of education: Not on file   Highest education level: Not on file  Occupational History   Not on file  Tobacco Use   Smoking status: Never Smoker   Smokeless tobacco: Never Used  Substance and Sexual Activity   Alcohol use: Not on file   Drug use: Not on file   Sexual activity: Not on file  Other Topics Concern   Not on file  Social History Narrative   Not on file   Social Determinants of Health   Financial Resource Strain:    Difficulty of Paying Living Expenses: Not on file  Food Insecurity:    Worried About Running Out of Food in the Last Year: Not on file   Ran Out of Food in the Last Year: Not on file  Transportation Needs:    Lack of Transportation (Medical): Not on file   Lack of Transportation (Non-Medical): Not on file  Physical Activity:    Days of Exercise per Week: Not on file   Minutes of Exercise per Session: Not on file  Stress:    Feeling of Stress : Not on file  Social Connections:    Frequency of Communication with Friends and Family: Not on file   Frequency of Social Gatherings with Friends and Family: Not on file   Attends Religious Services: Not on file   Active Member of Clubs or Organizations: Not on file   Attends Banker Meetings: Not on  file   Marital Status: Not on file  Intimate Partner Violence:    Fear of Current or Ex-Partner: Not on file   Emotionally Abused: Not on file   Physically Abused: Not on file   Sexually Abused: Not on file   Health Maintenance  Topic Date Due   HIV Screening  03/02/2021 (Originally 12/06/1975)   COLONOSCOPY  06/02/2021 (Originally 12/07/2011)   INFLUENZA VACCINE  05/10/2047 (Originally 04/05/2020)   TETANUS/TDAP  12/07/2021   MAMMOGRAM  01/27/2022   PAP SMEAR-Modifier  12/23/2022   COVID-19 Vaccine  Completed   Hepatitis C Screening  Completed    The following portions of the patient's history were reviewed and updated as appropriate: allergies, current medications, past family history, past medical history, past social history, past surgical history and problem list.  Review of Systems Pertinent items noted in HPI and remainder of comprehensive ROS otherwise negative.   Objective:    BP 134/71    Pulse 74    Ht 5\' 4"  (1.626 m)    Wt 151 lb (68.5 kg)    SpO2 97%    BMI 25.92 kg/m  General appearance: alert, cooperative and appears stated age Head: Normocephalic, without obvious abnormality,  atraumatic Eyes: conjunctivae/corneas clear. PERRL, EOM's intact. Fundi benign. Ears: normal TM's and external ear canals both ears Nose: Nares normal. Septum midline. Mucosa normal. No drainage or sinus tenderness. Throat: lips, mucosa, and tongue normal; teeth and gums normal Neck: no adenopathy, no carotid bruit, no JVD, supple, symmetrical, trachea midline and thyroid not enlarged, symmetric, no tenderness/mass/nodules Back: symmetric, no curvature. ROM normal. No CVA tenderness. Lungs: clear to auscultation bilaterally Heart: regular rate and rhythm, S1, S2 normal, no murmur, click, rub or gallop Abdomen: soft, non-tender; bowel sounds normal; no masses,  no organomegaly Extremities: extremities normal, atraumatic, no cyanosis or edema Pulses: 2+ and symmetric Skin: Skin  color, texture, turgor normal. No rashes or lesions Lymph nodes: Cervical, supraclavicular, and axillary nodes normal. Neurologic: Grossly normal   .. Depression screen [facility] 2/9 06/02/2020 03/02/2020 03/26/2019 05/18/2018 05/05/2017  Decreased Interest 1 1 3 1  0  Down, Depressed, Hopeless 1 1 3 1  0  PHQ - 2 Score 2 2 6 2  0  Altered sleeping 3 2 3 3  -  Tired, decreased energy 3 3 3 3  -  Change in appetite 0 0 0 0 -  Feeling bad or failure about yourself  0 0 0 0 -  Trouble concentrating 3 3 3 3  -  Moving slowly or fidgety/restless 0 0 3 0 -  Suicidal thoughts 0 0 0 0 -  PHQ-9 Score 11 10 18 11  -  Difficult doing work/chores Very difficult Somewhat difficult Very difficult Extremely dIfficult -   .. GAD 7 : Generalized Anxiety Score 06/02/2020 03/02/2020 03/26/2019 05/18/2018  Nervous, Anxious, on Edge 1 1 3 1   Control/stop worrying 1 1 3 1   Worry too much - different things 1 1 3 1   Trouble relaxing 1 1 3 1   Restless 0 1 3 0  Easily annoyed or irritable 1 0 1 0  Afraid - awful might happen 1 0 1 0  Total GAD 7 Score 6 5 17 4   Anxiety Difficulty Very difficult Not difficult at all Extremely difficult Not difficult at all     Assessment:    Healthy female exam.      Plan:     Teresa Taylor was seen today for annual exam.  Diagnoses and all orders for this visit:  Routine physical examination  Lumbar degenerative disc disease -     C-reactive protein  Brain fog -     B12  Peri-menopausal  Colon cancer screening -     Cologuard  Vitamin D insufficiency -     Vitamin D, Ergocalciferol, (DRISDOL) 1.25 MG (50000 UNIT) CAPS capsule; Take 1 capsule (50,000 Units total) by mouth every 7 (seven) days.   .. Discussed 150 minutes of exercise a week.  Encouraged vitamin D 1000 units and Calcium 1300mg  or 4 servings of dairy a day.  Fasting labs done and reviewed.  Pap and mammogram UTD.  cologuard ordered.  Flu/covid/shingles declined.  Vitamin D ordered.  b12 ordered to look for  reasons of brain fog.  Restart vvyanse with viibryd and see if helps brain and focus.   Follow up with PT and Dr. for discussion of what is best to order for patients intermittent low back pain.   Discussed menopausal symptoms and discuss symptomatic care. Follow up as needed. Mentioned options for sleep. Discussed lizzy herb shot to see if natural options help.   See After Visit Summary for Counseling Recommendations

## 2020-06-02 NOTE — Patient Instructions (Addendum)
Black Koh[place] and other herbs for hot flashes with lizzy herb shop  Belsomra/trazodone these I like to try first for sleep Gabapentin can help at bedtime with hot flashes  Consider shingles shot.   Please return your cologuard   Perimenopause  Perimenopause is the normal time of life before and after menstrual periods stop completely (menopause). Perimenopause can begin 2-8 years before menopause, and it usually lasts for 1 year after menopause. During perimenopause, the ovaries may or may not produce an egg. What are the causes? This condition is caused by a natural change in hormone levels that happens as you get older. What increases the risk? This condition is more likely to start at an earlier age if you have certain medical conditions or treatments, including:  A tumor of the pituitary gland in the brain.  A disease that affects the ovaries and hormone production.  Radiation treatment for cancer.  Certain cancer treatments, such as chemotherapy or hormone (anti-estrogen) therapy.  Heavy smoking and excessive alcohol use.  Family history of early menopause. What are the signs or symptoms? Perimenopausal changes affect each woman differently. Symptoms of this condition may include:  Hot flashes.  Night sweats.  Irregular menstrual periods.  Decreased sex drive.  Vaginal dryness.  Headaches.  Mood swings.  Depression.  Memory problems or trouble concentrating.  Irritability.  Tiredness.  Weight gain.  Anxiety.  Trouble getting pregnant. How is this diagnosed? This condition is diagnosed based on your medical history, a physical exam, your age, your menstrual history, and your symptoms. Hormone tests may also be done. How is this treated? In some cases, no treatment is needed. You and your health care provider should make a decision together about whether treatment is necessary. Treatment will be based on your individual condition and preferences.  Various treatments are available, such as:  Menopausal hormone therapy (MHT).  Medicines to treat specific symptoms.  Acupuncture.  Vitamin or herbal supplements. Before starting treatment, make sure to let your health care provider know if you have a personal or family history of:  Heart disease.  Breast cancer.  Blood clots.  Diabetes.  Osteoporosis. Follow these instructions at home: Lifestyle  Do not use any products that contain nicotine or tobacco, such as cigarettes and e-cigarettes. If you need help quitting, ask your health care provider.  Eat a balanced diet that includes fresh fruits and vegetables, whole grains, soybeans, eggs, lean meat, and low-fat dairy.  Get at least 30 minutes of physical activity on 5 or more days each week.  Avoid alcoholic and caffeinated beverages, as well as spicy foods. This may help prevent hot flashes.  Get 7-8 hours of sleep each night.  Dress in layers that can be removed to help you manage hot flashes.  Find ways to manage stress, such as deep breathing, meditation, or journaling. General instructions  Keep track of your menstrual periods, including: ? When they occur. ? How heavy they are and how long they last. ? How much time passes between periods.  Keep track of your symptoms, noting when they start, how often you have them, and how long they last.  Take over-the-counter and prescription medicines only as told by your health care provider.  Take vitamin supplements only as told by your health care provider. These may include calcium, vitamin E, and vitamin D.  Use vaginal lubricants or moisturizers to help with vaginal dryness and improve comfort during sex.  Talk with your health care provider before starting any herbal  supplements.  Keep all follow-up visits as told by your health care provider. This is important. This includes any group therapy or counseling. Contact a health care provider if:  You have heavy  vaginal bleeding or pass blood clots.  Your period lasts more than 2 days longer than normal.  Your periods are recurring sooner than 21 days.  You bleed after having sex. Get help right away if:  You have chest pain, trouble breathing, or trouble talking.  You have severe depression.  You have pain when you urinate.  You have severe headaches.  You have vision problems. Summary  Perimenopause is the time when a woman's body begins to move into menopause. This may happen naturally or as a result of other health problems or medical treatments.  Perimenopause can begin 2-8 years before menopause, and it usually lasts for 1 year after menopause.  Perimenopausal symptoms can be managed through medicines, lifestyle changes, and complementary therapies such as acupuncture. This information is not intended to replace advice given to you by your health care provider. Make sure you discuss any questions you have with your health care provider. Document Revised: 08/04/2017 Document Reviewed: 09/27/2016 Elsevier Patient Education  2020 Elsevier Inc[employer]  Insomnia Insomnia is a sleep disorder that makes it difficult to fall asleep or stay asleep. Insomnia can cause fatigue, low energy, difficulty concentrating, mood swings, and poor performance at work or school. There are three different ways to classify insomnia:  Difficulty falling asleep.  Difficulty staying asleep.  Waking up too early in the morning. Any type of insomnia can be long-term (chronic) or short-term (acute). Both are common. Short-term insomnia usually lasts for three months or less. Chronic insomnia occurs at least three times a week for longer than three months. What are the causes? Insomnia may be caused by another condition, situation, or substance, such as:  Anxiety.  Certain medicines.  Gastroesophageal reflux disease (GERD) or other gastrointestinal conditions.  Asthma or other breathing  conditions.  Restless legs syndrome, sleep apnea, or other sleep disorders.  Chronic pain.  Menopause.  Stroke.  Abuse of alcohol, tobacco, or illegal drugs.  Mental health conditions, such as depression.  Caffeine.  Neurological disorders, such as Alzheimer's disease.  An overactive thyroid (hyperthyroidism). Sometimes, the cause of insomnia may not be known. What increases the risk? Risk factors for insomnia include:  Gender. Women are affected more often than men.  Age. Insomnia is more common as you get older.  Stress.  Lack of exercise.  Irregular work schedule or working night shifts.  Traveling between different time zones.  Certain medical and mental health conditions. What are the signs or symptoms? If you have insomnia, the main symptom is having trouble falling asleep or having trouble staying asleep. This may lead to other symptoms, such as:  Feeling fatigued or having low energy.  Feeling nervous about going to sleep.  Not feeling rested in the morning.  Having trouble concentrating.  Feeling irritable, anxious, or depressed. How is this diagnosed? This condition may be diagnosed based on:  Your symptoms and medical history. Your health care provider may ask about: ? Your sleep habits. ? Any medical conditions you have. ? Your mental health.  A physical exam. How is this treated? Treatment for insomnia depends on the cause. Treatment may focus on treating an underlying condition that is causing insomnia. Treatment may also include:  Medicines to help you sleep.  Counseling or therapy.  Lifestyle adjustments to help you sleep better. Follow  these instructions at home: Eating and drinking   Limit or avoid alcohol, caffeinated beverages, and cigarettes, especially close to bedtime. These can disrupt your sleep.  Do not eat a large meal or eat spicy foods right before bedtime. This can lead to digestive discomfort that can make it hard  for you to sleep. Sleep habits   Keep a sleep diary to help you and your health care provider figure out what could be causing your insomnia. Write down: ? When you sleep. ? When you wake up during the night. ? How well you sleep. ? How rested you feel the next day. ? Any side effects of medicines you are taking. ? What you eat and drink.  Make your bedroom a dark, comfortable place where it is easy to fall asleep. ? Put up shades or blackout curtains to block light from outside. ? Use a white noise machine to block noise. ? Keep the temperature cool.  Limit screen use before bedtime. This includes: ? Watching TV. ? Using your smartphone, tablet, or computer.  Stick to a routine that includes going to bed and waking up at the same times every day and night. This can help you fall asleep faster. Consider making a quiet activity, such as reading, part of your nighttime routine.  Try to avoid taking naps during the day so that you sleep better at night.  Get out of bed if you are still awake after 15 minutes of trying to sleep. Keep the lights down, but try reading or doing a quiet activity. When you feel sleepy, go back to bed. General instructions  Take over-the-counter and prescription medicines only as told by your health care provider.  Exercise regularly, as told by your health care provider. Avoid exercise starting several hours before bedtime.  Use relaxation techniques to manage stress. Ask your health care provider to suggest some techniques that may work well for you. These may include: ? Breathing exercises. ? Routines to release muscle tension. ? Visualizing peaceful scenes.  Make sure that you drive carefully. Avoid driving if you feel very sleepy.  Keep all follow-up visits as told by your health care provider. This is important. Contact a health care provider if:  You are tired throughout the day.  You have trouble in your daily routine due to  sleepiness.  You continue to have sleep problems, or your sleep problems get worse. Get help right away if:  You have serious thoughts about hurting yourself or someone else. If you ever feel like you may hurt yourself or others, or have thoughts about taking your own life, get help right away. You can go to your nearest emergency department or call:  Your local emergency services (911 in the [place]  A suicide crisis helpline, such as the National Suicide Prevention Lifeline at 901 555 4269. This is open 24 hours a day. Summary  Insomnia is a sleep disorder that makes it difficult to fall asleep or stay asleep.  Insomnia can be long-term (chronic) or short-term (acute).  Treatment for insomnia depends on the cause. Treatment may focus on treating an underlying condition that is causing insomnia.  Keep a sleep diary to help you and your health care provider figure out what could be causing your insomnia. This information is not intended to replace advice given to you by your health care provider. Make sure you discuss any questions you have with your health care provider. Document Revised: 08/04/2017 Document Reviewed: 06/01/2017 Elsevier Patient Education  2020 Elsevier  Inc.    Health Maintenance, Female Adopting a healthy lifestyle and getting preventive care are important in promoting health and wellness. Ask your health care provider about:  The right schedule for you to have regular tests and exams.  Things you can do on your own to prevent diseases and keep yourself healthy. What should I know about diet, weight, and exercise? Eat a healthy diet   Eat a diet that includes plenty of vegetables, fruits, low-fat dairy products, and lean protein.  Do not eat a lot of foods that are high in solid fats, added sugars, or sodium. Maintain a healthy weight Body mass index (BMI) is used to identify weight problems. It estimates body fat based on height and weight. Your health  care provider can help determine your BMI and help you achieve or maintain a healthy weight. Get regular exercise Get regular exercise. This is one of the most important things you can do for your health. Most adults should:  Exercise for at least 150 minutes each week. The exercise should increase your heart rate and make you sweat (moderate-intensity exercise).  Do strengthening exercises at least twice a week. This is in addition to the moderate-intensity exercise.  Spend less time sitting. Even light physical activity can be beneficial. Watch cholesterol and blood lipids Have your blood tested for lipids and cholesterol at 58 years of age, then have this test every 5 years. Have your cholesterol levels checked more often if:  Your lipid or cholesterol levels are high.  You are older than 58 years of age.  You are at high risk for heart disease. What should I know about cancer screening? Depending on your health history and family history, you may need to have cancer screening at various ages. This may include screening for:  Breast cancer.  Cervical cancer.  Colorectal cancer.  Skin cancer.  Lung cancer. What should I know about heart disease, diabetes, and high blood pressure? Blood pressure and heart disease  High blood pressure causes heart disease and increases the risk of stroke. This is more likely to develop in people who have high blood pressure readings, are of African descent, or are overweight.  Have your blood pressure checked: ? Every 3-5 years if you are 4918-58 years of age. ? Every year if you are 58 years old or older. Diabetes Have regular diabetes screenings. This checks your fasting blood sugar level. Have the screening done:  Once every three years after age 58 if you are at a normal weight and have a low risk for diabetes.  More often and at a younger age if you are overweight or have a high risk for diabetes. What should I know about preventing  infection? Hepatitis B If you have a higher risk for hepatitis B, you should be screened for this virus. Talk with your health care provider to find out if you are at risk for hepatitis B infection. Hepatitis C Testing is recommended for:  Everyone born from [id number] through 1964.  Anyone with known risk factors for hepatitis C. Sexually transmitted infections (STIs)  Get screened for STIs, including gonorrhea and chlamydia, if: ? You are sexually active and are younger than 58 years of age. ? You are older than 58 years of age and your health care provider tells you that you are at risk for this type of infection. ? Your sexual activity has changed since you were last screened, and you are at increased risk for chlamydia or gonorrhea. Ask  your health care provider if you are at risk.  Ask your health care provider about whether you are at high risk for HIV. Your health care provider may recommend a prescription medicine to help prevent HIV infection. If you choose to take medicine to prevent HIV, you should first get tested for HIV. You should then be tested every 3 months for as long as you are taking the medicine. Pregnancy  If you are about to stop having your period (premenopausal) and you may become pregnant, seek counseling before you get pregnant.  Take 400 to 800 micrograms (mcg) of folic acid every day if you become pregnant.  Ask for birth control (contraception) if you want to prevent pregnancy. Osteoporosis and menopause Osteoporosis is a disease in which the bones lose minerals and strength with aging. This can result in bone fractures. If you are 70 years old or older, or if you are at risk for osteoporosis and fractures, ask your health care provider if you should:  Be screened for bone loss.  Take a calcium or vitamin D supplement to lower your risk of fractures.  Be given hormone replacement therapy (HRT) to treat symptoms of menopause. Follow these instructions at  home: Lifestyle  Do not use any products that contain nicotine or tobacco, such as cigarettes, e-cigarettes, and chewing tobacco. If you need help quitting, ask your health care provider.  Do not use street drugs.  Do not share needles.  Ask your health care provider for help if you need support or information about quitting drugs. Alcohol use  Do not drink alcohol if: ? Your health care provider tells you not to drink. ? You are pregnant, may be pregnant, or are planning to become pregnant.  If you drink alcohol: ? Limit how much you use to 0-1 drink a day. ? Limit intake if you are breastfeeding.  Be aware of how much alcohol is in your drink. In the [place] one drink equals one 12 oz bottle of beer (355 mL), one 5 oz glass of wine (148 mL), or one 1 oz glass of hard liquor (44 mL). General instructions  Schedule regular health, dental, and eye exams.  Stay current with your vaccines.  Tell your health care provider if: ? You often feel depressed. ? You have ever been abused or do not feel safe at home. Summary  Adopting a healthy lifestyle and getting preventive care are important in promoting health and wellness.  Follow your health care provider's instructions about healthy diet, exercising, and getting tested or screened for diseases.  Follow your health care provider's instructions on monitoring your cholesterol and blood pressure. This information is not intended to replace advice given to you by your health care provider. Make sure you discuss any questions you have with your health care provider. Document Revised: 08/15/2018 Document Reviewed: 08/15/2018 Elsevier Patient Education  2020 [employer].

## 2020-06-10 ENCOUNTER — Telehealth: Payer: Self-pay | Admitting: Neurology

## 2020-06-10 DIAGNOSIS — E559 Vitamin D deficiency, unspecified: Secondary | ICD-10-CM | POA: Insufficient documentation

## 2020-06-10 DIAGNOSIS — N951 Menopausal and female climacteric states: Secondary | ICD-10-CM | POA: Insufficient documentation

## 2020-06-10 DIAGNOSIS — R4189 Other symptoms and signs involving cognitive functions and awareness: Secondary | ICD-10-CM | POA: Insufficient documentation

## 2020-06-10 NOTE — Telephone Encounter (Signed)
Cologuard order faxed to [phone] with confirmation received. They will contact the patient directly.   

## 2020-08-03 DIAGNOSIS — Z23 Encounter for immunization: Secondary | ICD-10-CM | POA: Diagnosis not present

## 2020-08-14 ENCOUNTER — Other Ambulatory Visit: Payer: Self-pay | Admitting: Sports Medicine

## 2020-08-14 DIAGNOSIS — M5136 Other intervertebral disc degeneration, lumbar region: Secondary | ICD-10-CM

## 2020-12-12 ENCOUNTER — Other Ambulatory Visit: Payer: Self-pay | Admitting: Sports Medicine

## 2020-12-12 DIAGNOSIS — M5136 Other intervertebral disc degeneration, lumbar region: Secondary | ICD-10-CM

## 2020-12-28 DIAGNOSIS — N83291 Other ovarian cyst, right side: Secondary | ICD-10-CM | POA: Diagnosis not present

## 2020-12-28 DIAGNOSIS — R102 Pelvic and perineal pain: Secondary | ICD-10-CM | POA: Diagnosis not present

## 2020-12-28 DIAGNOSIS — N951 Menopausal and female climacteric states: Secondary | ICD-10-CM | POA: Diagnosis not present

## 2021-01-13 DIAGNOSIS — N83209 Unspecified ovarian cyst, unspecified side: Secondary | ICD-10-CM | POA: Diagnosis not present

## 2021-01-13 DIAGNOSIS — R102 Pelvic and perineal pain: Secondary | ICD-10-CM | POA: Diagnosis not present

## 2021-02-08 ENCOUNTER — Encounter: Payer: Self-pay | Admitting: Physician Assistant

## 2021-02-10 ENCOUNTER — Encounter: Payer: Self-pay | Admitting: Sports Medicine

## 2021-02-10 ENCOUNTER — Other Ambulatory Visit: Payer: Self-pay

## 2021-02-10 ENCOUNTER — Ambulatory Visit: Payer: BC Managed Care – PPO | Admitting: Sports Medicine

## 2021-02-10 DIAGNOSIS — L739 Follicular disorder, unspecified: Secondary | ICD-10-CM | POA: Insufficient documentation

## 2021-02-10 MED ORDER — DOXYCYCLINE HYCLATE 100 MG PO TABS: 100.0000 mg | ORAL_TABLET | Freq: Two times a day (BID) | ORAL | 0 refills | Status: DC

## 2021-02-10 NOTE — Patient Instructions (Signed)

## 2021-02-10 NOTE — Progress Notes (Signed)
    Procedures performed today:    None.  Independent interpretation of notes and tests performed by another provider:   None.  Brief History, Exam, Impression, and Recommendations:    Folliculitis This is a pleasant 60 year old female, for 2 months now she has had a painful spot on her left calf, on exam she appears to have a folliculitis, tender to palpation, erythematous. Adding a course of doxycycline, she will do some warm compresses, she thinks this started after her dog scratched her leg. I like to see her back in about 2 weeks and if insufficient improvement we will consider biopsy.    ___________________________________________ Ihor Austin. Benjamin Stain, M.D., ABFM., CAQSM. Primary Care and Sports Medicine [facility]  Adjunct Instructor of Family Medicine  [employer]

## 2021-02-10 NOTE — Assessment & Plan Note (Signed)
This is a pleasant 60 year old female, for 2 months now she has had a painful spot on her left calf, on exam she appears to have a folliculitis, tender to palpation, erythematous. Adding a course of doxycycline, she will do some warm compresses, she thinks this started after her dog scratched her leg. I like to see her back in about 2 weeks and if insufficient improvement we will consider biopsy.

## 2021-02-15 DIAGNOSIS — Z01419 Encounter for gynecological examination (general) (routine) without abnormal findings: Secondary | ICD-10-CM | POA: Diagnosis not present

## 2021-02-15 DIAGNOSIS — Z1231 Encounter for screening mammogram for malignant neoplasm of breast: Secondary | ICD-10-CM | POA: Diagnosis not present

## 2021-02-15 DIAGNOSIS — Z6828 Body mass index (BMI) 28.0-28.9, adult: Secondary | ICD-10-CM | POA: Diagnosis not present

## 2021-02-16 ENCOUNTER — Encounter (INDEPENDENT_AMBULATORY_CARE_PROVIDER_SITE_OTHER): Payer: BC Managed Care – PPO

## 2021-02-16 DIAGNOSIS — L739 Follicular disorder, unspecified: Secondary | ICD-10-CM | POA: Diagnosis not present

## 2021-02-16 MED ORDER — DOXYCYCLINE HYCLATE 100 MG PO TABS: 100.0000 mg | ORAL_TABLET | Freq: Two times a day (BID) | ORAL | 0 refills | Status: AC

## 2021-02-16 NOTE — Telephone Encounter (Signed)
I spent 5 total minutes of online digital evaluation and management services. 

## 2021-02-22 ENCOUNTER — Other Ambulatory Visit: Payer: Self-pay | Admitting: Obstetrics and Gynecology

## 2021-02-22 DIAGNOSIS — R928 Other abnormal and inconclusive findings on diagnostic imaging of breast: Secondary | ICD-10-CM

## 2021-03-12 ENCOUNTER — Other Ambulatory Visit: Payer: Self-pay

## 2021-03-12 ENCOUNTER — Ambulatory Visit
Admission: RE | Admit: 2021-03-12 | Discharge: 2021-03-12 | Disposition: A | Discharge status: Home / Self Care | Payer: BC Managed Care – PPO | Source: Ambulatory Visit | Attending: Obstetrics and Gynecology | Admitting: Obstetrics and Gynecology

## 2021-03-12 ENCOUNTER — Other Ambulatory Visit: Payer: Self-pay | Admitting: Obstetrics and Gynecology

## 2021-03-12 DIAGNOSIS — R928 Other abnormal and inconclusive findings on diagnostic imaging of breast: Secondary | ICD-10-CM

## 2021-03-12 DIAGNOSIS — R922 Inconclusive mammogram: Secondary | ICD-10-CM | POA: Diagnosis not present

## 2021-03-12 LAB — HM MAMMOGRAPHY

## 2021-03-14 ENCOUNTER — Other Ambulatory Visit: Payer: Self-pay | Admitting: Physician Assistant

## 2021-03-14 DIAGNOSIS — F3341 Major depressive disorder, recurrent, in partial remission: Secondary | ICD-10-CM

## 2021-03-14 DIAGNOSIS — F411 Generalized anxiety disorder: Secondary | ICD-10-CM

## 2021-03-15 ENCOUNTER — Encounter: Payer: Self-pay | Admitting: Physician Assistant

## 2021-03-15 MED ORDER — DIAZEPAM 5 MG PO TABS: ORAL_TABLET | ORAL | 0 refills | Status: DC

## 2021-03-31 ENCOUNTER — Ambulatory Visit
Admission: RE | Admit: 2021-03-31 | Discharge: 2021-03-31 | Disposition: A | Discharge status: Home / Self Care | Payer: BC Managed Care – PPO | Source: Ambulatory Visit | Attending: Obstetrics and Gynecology | Admitting: Obstetrics and Gynecology

## 2021-03-31 ENCOUNTER — Other Ambulatory Visit: Payer: Self-pay | Admitting: Obstetrics and Gynecology

## 2021-03-31 ENCOUNTER — Other Ambulatory Visit: Payer: Self-pay

## 2021-03-31 DIAGNOSIS — N6022 Fibroadenosis of left breast: Secondary | ICD-10-CM | POA: Diagnosis not present

## 2021-03-31 DIAGNOSIS — R928 Other abnormal and inconclusive findings on diagnostic imaging of breast: Secondary | ICD-10-CM | POA: Diagnosis not present

## 2021-04-19 ENCOUNTER — Encounter: Payer: Self-pay | Admitting: Physician Assistant

## 2021-04-22 ENCOUNTER — Encounter (INDEPENDENT_AMBULATORY_CARE_PROVIDER_SITE_OTHER): Payer: BC Managed Care – PPO

## 2021-04-22 DIAGNOSIS — L739 Follicular disorder, unspecified: Secondary | ICD-10-CM | POA: Diagnosis not present

## 2021-04-23 MED ORDER — SULFAMETHOXAZOLE-TRIMETHOPRIM 800-160 MG PO TABS: 1.0000 | ORAL_TABLET | Freq: Two times a day (BID) | ORAL | 0 refills | Status: DC

## 2021-04-23 NOTE — Telephone Encounter (Signed)
I spent 5 total minutes of online digital evaluation and management services. 

## 2021-05-05 ENCOUNTER — Encounter: Payer: Self-pay | Admitting: Family Medicine

## 2021-05-28 ENCOUNTER — Encounter: Payer: Self-pay | Admitting: Physician Assistant

## 2021-05-28 ENCOUNTER — Ambulatory Visit (INDEPENDENT_AMBULATORY_CARE_PROVIDER_SITE_OTHER): Payer: BC Managed Care – PPO | Admitting: Physician Assistant

## 2021-05-28 VITALS — BP 144/71 | HR 64 | Ht 64.0 in | Wt 160.0 lb

## 2021-05-28 DIAGNOSIS — Z13220 Encounter for screening for lipoid disorders: Secondary | ICD-10-CM

## 2021-05-28 DIAGNOSIS — H6992 Unspecified Eustachian tube disorder, left ear: Secondary | ICD-10-CM

## 2021-05-28 DIAGNOSIS — R7309 Other abnormal glucose: Secondary | ICD-10-CM | POA: Diagnosis not present

## 2021-05-28 DIAGNOSIS — Z131 Encounter for screening for diabetes mellitus: Secondary | ICD-10-CM

## 2021-05-28 DIAGNOSIS — E559 Vitamin D deficiency, unspecified: Secondary | ICD-10-CM | POA: Diagnosis not present

## 2021-05-28 DIAGNOSIS — Z0000 Encounter for general adult medical examination without abnormal findings: Secondary | ICD-10-CM

## 2021-05-28 DIAGNOSIS — F900 Attention-deficit hyperactivity disorder, predominantly inattentive type: Secondary | ICD-10-CM

## 2021-05-28 DIAGNOSIS — F3341 Major depressive disorder, recurrent, in partial remission: Secondary | ICD-10-CM

## 2021-05-28 DIAGNOSIS — H6982 Other specified disorders of Eustachian tube, left ear: Secondary | ICD-10-CM

## 2021-05-28 DIAGNOSIS — R030 Elevated blood-pressure reading, without diagnosis of hypertension: Secondary | ICD-10-CM

## 2021-05-28 DIAGNOSIS — F411 Generalized anxiety disorder: Secondary | ICD-10-CM

## 2021-05-28 DIAGNOSIS — Z1329 Encounter for screening for other suspected endocrine disorder: Secondary | ICD-10-CM

## 2021-05-28 MED ORDER — VILAZODONE HCL 20 MG PO TABS: 1.0000 | ORAL_TABLET | Freq: Every day | ORAL | 1 refills | Status: DC

## 2021-05-28 MED ORDER — METHYLPREDNISOLONE 4 MG PO TBPK: ORAL_TABLET | ORAL | 0 refills | Status: DC

## 2021-05-28 MED ORDER — FLUTICASONE PROPIONATE 50 MCG/ACT NA SUSP: 2.0000 | Freq: Every day | NASAL | 0 refills | Status: DC

## 2021-05-28 MED ORDER — AMPHETAMINE-DEXTROAMPHET ER 10 MG PO CP24: 10.0000 mg | ORAL_CAPSULE | Freq: Every day | ORAL | 0 refills | Status: DC

## 2021-05-28 NOTE — Progress Notes (Addendum)
Subjective:     Teresa Taylor is a 60 y.o. female and is here for a comprehensive physical exam. The patient reports problems - she is having some left ear and sinus pressure and some left sided sinus tenderness. No fever, chills, body aches. Not taking anything for it. She just finished doxycycline for a leg infection .  Social History   Socioeconomic History   Marital status: Divorced    Spouse name: Not on file   Number of children: Not on file   Years of education: Not on file   Highest education level: Not on file  Occupational History   Not on file  Tobacco Use   Smoking status: Never   Smokeless tobacco: Never  Substance and Sexual Activity   Alcohol use: Not on file   Drug use: Not on file   Sexual activity: Not on file  Other Topics Concern   Not on file  Social History Narrative   Not on file   Social Determinants of Health   Financial Resource Strain: Not on file  Food Insecurity: Not on file  Transportation Needs: Not on file  Physical Activity: Not on file  Stress: Not on file  Social Connections: Not on file  Intimate Partner Violence: Not on file   Health Maintenance  Topic Date Due   HIV Screening  Never done   COVID-19 Vaccine (3 - Booster for Pfizer series) 06/13/2021 (Originally 06/04/2020)   Zoster Vaccines- Shingrix (1 of 2) 08/27/2021 (Originally 12/07/2011)   Fecal DNA (Cologuard)  05/28/2022 (Originally 12/07/2011)   INFLUENZA VACCINE  05/10/2047 (Originally 04/05/2021)   TETANUS/TDAP  12/07/2021   MAMMOGRAM  01/27/2022   PAP SMEAR-Modifier  12/23/2022   Hepatitis C Screening  Completed   HPV VACCINES  Aged Out    The following portions of the patient's history were reviewed and updated as appropriate: allergies, current medications, past family history, past medical history, past social history, past surgical history, and problem list.  Review of Systems Pertinent items noted in HPI and remainder of comprehensive ROS otherwise negative.    Objective:    BP (!) 144/71   Pulse 64   Ht 5\' 4"  (1.626 m)   Wt 160 lb (72.6 kg)   SpO2 97%   BMI 27.46 kg/m  General appearance: alert, cooperative, appears stated age, and mildly obese Head: Normocephalic, without obvious abnormality, atraumatic Eyes: conjunctivae/corneas clear. PERRL, EOM's intact. Fundi benign. Ears: normal TM's and external ear canals both ears Nose: Nares normal. Septum midline. Mucosa normal. No drainage or sinus tenderness. Throat: lips, mucosa, and tongue normal; teeth and gums normal Neck: no adenopathy, no carotid bruit, no JVD, supple, symmetrical, trachea midline, and thyroid not enlarged, symmetric, no tenderness/mass/nodules Back: symmetric, no curvature. ROM normal. No CVA tenderness. Lungs: clear to auscultation bilaterally Heart: regular rate and rhythm, S1, S2 normal, no murmur, click, rub or gallop Abdomen: soft, non-tender; bowel sounds normal; no masses,  no organomegaly Extremities: extremities normal, atraumatic, no cyanosis or edema Pulses: 2+ and symmetric Skin: Skin color, texture, turgor normal. No rashes or lesions or lots of skin aging spots Neurologic: Grossly normal   .. Depression screen [facility] 2/9 05/28/2021 06/02/2020 03/02/2020 03/26/2019 05/18/2018  Decreased Interest 2 1 1 3 1   Down, Depressed, Hopeless 1 1 1 3 1   PHQ - 2 Score 3 2 2 6 2   Altered sleeping 2 3 2 3 3   Tired, decreased energy 3 3 3 3 3   Change in appetite 1 0 0 0  0  Feeling bad or failure about yourself  0 0 0 0 0  Trouble concentrating 3 3 3 3 3   Moving slowly or fidgety/restless 1 0 0 3 0  Suicidal thoughts 0 0 0 0 0  PHQ-9 Score 13 11 10 18 11   Difficult doing work/chores Very difficult Very difficult Somewhat difficult Very difficult Extremely dIfficult   . GAD 7 : Generalized Anxiety Score 05/28/2021 06/02/2020 03/02/2020 03/26/2019  Nervous, Anxious, on Edge 1 1 1 3   Control/stop worrying 0 1 1 3   Worry too much - different things 0 1 1 3   Trouble  relaxing 1 1 1 3   Restless 0 0 1 3  Easily annoyed or irritable 0 1 0 1  Afraid - awful might happen 0 1 0 1  Total GAD 7 Score 2 6 5 17   Anxiety Difficulty Somewhat difficult Very difficult Not difficult at all Extremely difficult     Assessment:    Healthy female exam.      Plan:    6/30/20217/23/2020[name] was seen today for annual exam.  Diagnoses and all orders for this visit:  Routine physical examination -     TSH -     Lipid Panel w/reflex Direct LDL -     COMPLETE METABOLIC PANEL WITH GFR -     CBC with Differential/Platelet -     Vitamin D (25 hydroxy)  Vitamin D insufficiency -     Vitamin D (25 hydroxy)  Screening for lipid disorders -     Lipid Panel w/reflex Direct LDL  Elevated glucose -     COMPLETE METABOLIC PANEL WITH GFR  Thyroid disorder screen -     TSH  Diabetes mellitus screening -     COMPLETE METABOLIC PANEL WITH GFR  Generalized anxiety disorder -     Vilazodone HCl (VIIBRYD) 20 MG TABS; Take 1 tablet (20 mg total) by mouth daily.  Recurrent major depressive disorder, in partial remission (HCC) -     Vilazodone HCl (VIIBRYD) 20 MG TABS; Take 1 tablet (20 mg total) by mouth daily.  Acute dysfunction of left eustachian tube -     fluticasone (FLONASE) 50 MCG/ACT nasal spray; Place 2 sprays into both nostrils daily. -     methylPREDNISolone (MEDROL DOSEPAK) 4 MG TBPK tablet; Take as directed by package insert.  Attention deficit hyperactivity disorder (ADHD), predominantly inattentive type -     amphetamine-dextroamphetamine (ADDERALL XR) 10 MG 24 hr capsule; Take 1 capsule (10 mg total) by mouth daily.  .. Discussed 150 minutes of exercise a week.  Encouraged vitamin D 1000 units and Calcium 1300mg  or 4 servings of dairy a day.  PHQ is elevated but mostly due to concentration and fatigue. Pt does not feel depressed.  Added 10mg  XR to try since vyvanse makes her feel jittery.  Continue viibrd.  Fasting labs ordered.  Mammogram UTD.  No need for pap.   Will return cologuard.  Declined flu/shingrix. Covid vaccine x2. Needs booster.   Discussed allergies causing some eustachian tube dysfunction. Start flonase and zyrtec D. If not improving start medrol dose pack. No signs of bacterial infection.   Elevated BP today. Pt will monitor for the next 2-3 weeks and report back.   See After Visit Summary for Counseling Recommendations

## 2021-05-28 NOTE — Patient Instructions (Signed)
Health Maintenance, Female Adopting a healthy lifestyle and getting preventive care are important in promoting health and wellness. Ask your health care provider about: The right schedule for you to have regular tests and exams. Things you can do on your own to prevent diseases and keep yourself healthy. What should I know about diet, weight, and exercise? Eat a healthy diet  Eat a diet that includes plenty of vegetables, fruits, low-fat dairy products, and lean protein. Do not eat a lot of foods that are high in solid fats, added sugars, or sodium. Maintain a healthy weight Body mass index (BMI) is used to identify weight problems. It estimates body fat based on height and weight. Your health care provider can help determine your BMI and help you achieve or maintain a healthy weight. Get regular exercise Get regular exercise. This is one of the most important things you can do for your health. Most adults should: Exercise for at least 150 minutes each week. The exercise should increase your heart rate and make you sweat (moderate-intensity exercise). Do strengthening exercises at least twice a week. This is in addition to the moderate-intensity exercise. Spend less time sitting. Even light physical activity can be beneficial. Watch cholesterol and blood lipids Have your blood tested for lipids and cholesterol at 59 years of age, then have this test every 5 years. Have your cholesterol levels checked more often if: Your lipid or cholesterol levels are high. You are older than 59 years of age. You are at high risk for heart disease. What should I know about cancer screening? Depending on your health history and family history, you may need to have cancer screening at various ages. This may include screening for: Breast cancer. Cervical cancer. Colorectal cancer. Skin cancer. Lung cancer. What should I know about heart disease, diabetes, and high blood pressure? Blood pressure and heart  disease High blood pressure causes heart disease and increases the risk of stroke. This is more likely to develop in people who have high blood pressure readings, are of African descent, or are overweight. Have your blood pressure checked: Every 3-5 years if you are 18-39 years of age. Every year if you are 40 years old or older. Diabetes Have regular diabetes screenings. This checks your fasting blood sugar level. Have the screening done: Once every three years after age 40 if you are at a normal weight and have a low risk for diabetes. More often and at a younger age if you are overweight or have a high risk for diabetes. What should I know about preventing infection? Hepatitis B If you have a higher risk for hepatitis B, you should be screened for this virus. Talk with your health care provider to find out if you are at risk for hepatitis B infection. Hepatitis C Testing is recommended for: Everyone born from 1945 through 1965. Anyone with known risk factors for hepatitis C. Sexually transmitted infections (STIs) Get screened for STIs, including gonorrhea and chlamydia, if: You are sexually active and are younger than 59 years of age. You are older than 59 years of age and your health care provider tells you that you are at risk for this type of infection. Your sexual activity has changed since you were last screened, and you are at increased risk for chlamydia or gonorrhea. Ask your health care provider if you are at risk. Ask your health care provider about whether you are at high risk for HIV. Your health care provider may recommend a prescription medicine   to help prevent HIV infection. If you choose to take medicine to prevent HIV, you should first get tested for HIV. You should then be tested every 3 months for as long as you are taking the medicine. Pregnancy If you are about to stop having your period (premenopausal) and you may become pregnant, seek counseling before you get  pregnant. Take 400 to 800 micrograms (mcg) of folic acid every day if you become pregnant. Ask for birth control (contraception) if you want to prevent pregnancy. Osteoporosis and menopause Osteoporosis is a disease in which the bones lose minerals and strength with aging. This can result in bone fractures. If you are 65 years old or older, or if you are at risk for osteoporosis and fractures, ask your health care provider if you should: Be screened for bone loss. Take a calcium or vitamin D supplement to lower your risk of fractures. Be given hormone replacement therapy (HRT) to treat symptoms of menopause. Follow these instructions at home: Lifestyle Do not use any products that contain nicotine or tobacco, such as cigarettes, e-cigarettes, and chewing tobacco. If you need help quitting, ask your health care provider. Do not use street drugs. Do not share needles. Ask your health care provider for help if you need support or information about quitting drugs. Alcohol use Do not drink alcohol if: Your health care provider tells you not to drink. You are pregnant, may be pregnant, or are planning to become pregnant. If you drink alcohol: Limit how much you use to 0-1 drink a day. Limit intake if you are breastfeeding. Be aware of how much alcohol is in your drink. In the [place] one drink equals one 12 oz bottle of beer (355 mL), one 5 oz glass of wine (148 mL), or one 1 oz glass of hard liquor (44 mL). General instructions Schedule regular health, dental, and eye exams. Stay current with your vaccines. Tell your health care provider if: You often feel depressed. You have ever been abused or do not feel safe at home. Summary Adopting a healthy lifestyle and getting preventive care are important in promoting health and wellness. Follow your health care provider's instructions about healthy diet, exercising, and getting tested or screened for diseases. Follow your health care provider's  instructions on monitoring your cholesterol and blood pressure. This information is not intended to replace advice given to you by your health care provider. Make sure you discuss any questions you have with your health care provider. Document Revised: 10/30/2020 Document Reviewed: 08/15/2018 Elsevier Patient Education  2022 Elsevier Inc.  

## 2021-05-31 NOTE — Progress Notes (Signed)
Shiara,   Thyroid looks great.  Kidney, liver, glucose looks great.  Vitamin d normal range. Keep supplementing with at least 2000 units a day.  HDL, good cholesterol, looks great.  LDL, bad cholesterol, right at optimal level.  Hemoglobin still low. Add ferritin/iron panel. Need to see if why to supplement with some iron.  Eosinophils elevated. Looks like need to stay on a zyrtec or claritin daily.

## 2021-06-03 LAB — CBC WITH DIFFERENTIAL/PLATELET
Absolute Monocytes: 537 cells/uL (ref 200–950)
Basophils Absolute: 88 cells/uL (ref 0–200)
Basophils Relative: 1.3 %
Eosinophils Absolute: 592 cells/uL — ABNORMAL HIGH (ref 15–500)
Eosinophils Relative: 8.7 %
HCT: 37.4 % (ref 35.0–45.0)
Hemoglobin: 11.3 g/dL — ABNORMAL LOW (ref 11.7–15.5)
Lymphs Abs: 2128 cells/uL (ref 850–3900)
MCH: 20.8 pg — ABNORMAL LOW (ref 27.0–33.0)
MCHC: 30.2 g/dL — ABNORMAL LOW (ref 32.0–36.0)
MCV: 68.9 fL — ABNORMAL LOW (ref 80.0–100.0)
Monocytes Relative: 7.9 %
Neutro Abs: 3454 cells/uL (ref 1500–7800)
Neutrophils Relative %: 50.8 %
Platelets: 229 10*3/uL (ref 140–400)
RBC: 5.43 10*6/uL — ABNORMAL HIGH (ref 3.80–5.10)
RDW: 16.3 % — ABNORMAL HIGH (ref 11.0–15.0)
Total Lymphocyte: 31.3 %
WBC: 6.8 10*3/uL (ref 3.8–10.8)

## 2021-06-03 LAB — VITAMIN D 25 HYDROXY (VIT D DEFICIENCY, FRACTURES): Vit D, 25-Hydroxy: 39 ng/mL (ref 30–100)

## 2021-06-03 LAB — LIPID PANEL W/REFLEX DIRECT LDL
Cholesterol: 179 mg/dL (ref <200)
HDL: 55 mg/dL (ref >=50)
LDL Cholesterol (Calc): 107 mg/dL (calc) — ABNORMAL HIGH
Non-HDL Cholesterol (Calc): 124 mg/dL (calc) (ref <130)
Total CHOL/HDL Ratio: 3.3 (calc) (ref <5.0)
Triglycerides: 79 mg/dL (ref <150)

## 2021-06-03 LAB — COMPLETE METABOLIC PANEL WITH GFR
AG Ratio: 1.6 (calc) (ref 1.0–2.5)
ALT: 17 U/L (ref 6–29)
AST: 19 U/L (ref 10–35)
Albumin: 4.5 g/dL (ref 3.6–5.1)
Alkaline phosphatase (APISO): 81 U/L (ref 37–153)
BUN: 18 mg/dL (ref 7–25)
CO2: 28 mmol/L (ref 20–32)
Calcium: 9.7 mg/dL (ref 8.6–10.4)
Chloride: 107 mmol/L (ref 98–110)
Creat: 0.95 mg/dL (ref 0.50–1.03)
Globulin: 2.9 g/dL (calc) (ref 1.9–3.7)
Glucose, Bld: 87 mg/dL (ref 65–99)
Potassium: 4.3 mmol/L (ref 3.5–5.3)
Sodium: 142 mmol/L (ref 135–146)
Total Bilirubin: 0.5 mg/dL (ref 0.2–1.2)
Total Protein: 7.4 g/dL (ref 6.1–8.1)
eGFR: 69 mL/min/{1.73_m2} (ref >=60)

## 2021-06-03 LAB — CBC MORPHOLOGY

## 2021-06-03 LAB — IRON,TIBC AND FERRITIN PANEL
%SAT: 29 % (calc) (ref 16–45)
Ferritin: 32 ng/mL (ref 16–232)
Iron: 98 ug/dL (ref 45–160)
TIBC: 333 mcg/dL (calc) (ref 250–450)

## 2021-06-03 LAB — TSH: TSH: 2.7 mIU/L (ref 0.40–4.50)

## 2021-06-03 NOTE — Progress Notes (Signed)
Serum iron normal range.  Iron stores 32 goal above 40.

## 2021-06-20 ENCOUNTER — Other Ambulatory Visit: Payer: Self-pay | Admitting: Physician Assistant

## 2021-06-20 DIAGNOSIS — H6982 Other specified disorders of Eustachian tube, left ear: Secondary | ICD-10-CM

## 2021-06-22 ENCOUNTER — Other Ambulatory Visit: Payer: Self-pay | Admitting: Physician Assistant

## 2021-06-22 ENCOUNTER — Encounter: Payer: Self-pay | Admitting: Physician Assistant

## 2021-06-22 DIAGNOSIS — F900 Attention-deficit hyperactivity disorder, predominantly inattentive type: Secondary | ICD-10-CM

## 2021-06-22 DIAGNOSIS — H6982 Other specified disorders of Eustachian tube, left ear: Secondary | ICD-10-CM

## 2021-06-22 MED ORDER — AMPHETAMINE-DEXTROAMPHET ER 10 MG PO CP24: 10.0000 mg | ORAL_CAPSULE | Freq: Every day | ORAL | 0 refills | Status: DC

## 2021-06-28 ENCOUNTER — Encounter: Payer: Self-pay | Admitting: Physician Assistant

## 2021-06-28 MED ORDER — AMPHETAMINE-DEXTROAMPHET ER 5 MG PO CP24: 10.0000 mg | ORAL_CAPSULE | Freq: Every day | ORAL | 0 refills | Status: DC

## 2021-06-29 ENCOUNTER — Encounter: Payer: Self-pay | Admitting: Physician Assistant

## 2021-06-30 MED ORDER — AMPHETAMINE-DEXTROAMPHET ER 5 MG PO CP24
10.0000 mg | ORAL_CAPSULE | Freq: Every day | ORAL | 0 refills | Status: DC
Start: 2021-06-30 — End: 2021-10-15

## 2021-08-24 DIAGNOSIS — R509 Fever, unspecified: Secondary | ICD-10-CM | POA: Diagnosis not present

## 2021-08-24 DIAGNOSIS — U071 COVID-19: Secondary | ICD-10-CM | POA: Diagnosis not present

## 2021-08-27 DIAGNOSIS — U071 COVID-19: Secondary | ICD-10-CM | POA: Diagnosis not present

## 2021-09-30 ENCOUNTER — Encounter: Payer: Self-pay | Admitting: Physician Assistant

## 2021-10-15 ENCOUNTER — Ambulatory Visit (INDEPENDENT_AMBULATORY_CARE_PROVIDER_SITE_OTHER): Payer: BC Managed Care – PPO | Admitting: Physician Assistant

## 2021-10-15 ENCOUNTER — Other Ambulatory Visit: Payer: Self-pay

## 2021-10-15 ENCOUNTER — Encounter: Payer: Self-pay | Admitting: Physician Assistant

## 2021-10-15 VITALS — BP 148/67 | HR 77 | Ht 64.0 in | Wt 162.0 lb

## 2021-10-15 DIAGNOSIS — R002 Palpitations: Secondary | ICD-10-CM

## 2021-10-15 DIAGNOSIS — M25531 Pain in right wrist: Secondary | ICD-10-CM

## 2021-10-15 DIAGNOSIS — Z8616 Personal history of COVID-19: Secondary | ICD-10-CM

## 2021-10-15 DIAGNOSIS — R052 Subacute cough: Secondary | ICD-10-CM | POA: Diagnosis not present

## 2021-10-15 MED ORDER — METOPROLOL SUCCINATE ER 25 MG PO TB24: 25.0000 mg | ORAL_TABLET | Freq: Every day | ORAL | 1 refills | Status: DC

## 2021-10-15 NOTE — Progress Notes (Signed)
Subjective:    Patient ID: Teresa Taylor, female    DOB: 05-11-1961, 61 y.o.   MRN: [id number]  HPI Pt is a 61 yo female with ADHD, GAD who presents to the clinic for dry cough, palpitations, and right wrist pain.   She had covid late December and her dry cough and palpitations started soon after that. She did not have a bad case of covid and recovered quickly. Since she feels like her heart is racing and beating weird at times. She does not notice it as much during the day but more when she lays down at night. No CP, swelling, SOB. Her dry cough seems to be associated with the palpitations and coughing starts when heart starts beating weird. No diet or drinking changes. She has one cup of coffee in mornings. She stopped viibyrd and adderall. Her mood has been ok.   She is also having right anterior wrist pain on radial side. 1st week of November her right thumb got pulled back in a grocery cart. She worse a brace and iced it but not completely better. She continues to notice pain with certain movements and if touched at a specific area.   .. 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/01/2007   INSOMNIA 02/15/2008   DOMESTIC ABUSE 02/06/2009   ADHD (attention deficit hyperactivity disorder) 01/09/2013   Abnormal weight gain 09/18/2015   Generalized anxiety disorder 10/26/2015   Depression 10/27/2015   Atypical chest pain 04/08/2016   IDA (iron deficiency anemia) 04/08/2016   Microcytic anemia 04/10/2016   Overweight 05/09/2017   Low energy 05/20/2018   Excessive daytime sleepiness 05/20/2018   Lumbar degenerative disc disease 07/31/2019   Near syncope 09/17/2019   Dizziness 09/17/2019   Brain fog 06/10/2020   Peri-menopausal 06/10/2020   Vitamin D insufficiency 06/10/2020   Folliculitis 02/10/2021   Elevated blood pressure reading 05/28/2021   Palpitations 10/20/2021   Subacute cough 10/20/2021   History of COVID-19 10/20/2021   Right  wrist pain 10/20/2021   Resolved Ambulatory Problems    Diagnosis Date Noted   Muscle spasm of back 02/21/2014   Right low back pain 02/21/2014   Past Medical History:  Diagnosis Date   Allergy      Review of Systems See HPI.     Objective:   Physical Exam Vitals reviewed.  Constitutional:      Appearance: Normal appearance.  HENT:     Head: Normocephalic.  Cardiovascular:     Rate and Rhythm: Normal rate and regular rhythm.     Pulses: Normal pulses.     Heart sounds: Normal heart sounds.     Comments: A few PVC heard on auscultation Pulmonary:     Effort: Pulmonary effort is normal.     Breath sounds: Normal breath sounds.  Musculoskeletal:       Arms:     Right lower leg: No edema.     Left lower leg: No edema.     Comments: NROM of right wrist. Strength right wrist 5/5.  Negative finklestein.   Lymphadenopathy:     Cervical: No cervical adenopathy.  Neurological:     General: No focal deficit present.     Mental Status: She is alert and oriented to person, place, and time.  Psychiatric:        Mood and Affect: Mood normal.      EKG: NSR, no concerning ST elevation or depression.  Rhythm strip PVC witnessed  Assessment & Plan:  Marland KitchenMarland KitchenTammy was seen today for follow-up.  Diagnoses and all orders for this visit:  Palpitations -     EKG 12-Lead -     metoprolol succinate (TOPROL-XL) 25 MG 24 hr tablet; Take 1 tablet (25 mg total) by mouth daily.  Right wrist pain  History of COVID-19  Subacute cough   Suspect some cardiac instability from covid infection with PVCs. Suspect cough is caused by PVC. No work up for cough right now. Lungs sound great.  HO given of causes and conservative things to do to treat. Start metoprolol since so symptomatic at night. Discussed side effects Follow up in 1 month.   Consider follow up with Dr. Karie Schwalbe for FCR injury.  HO given for exercises.  Ice area with as needed NSAIds.

## 2021-10-15 NOTE — Patient Instructions (Addendum)
Metoprolol Extended-Release Capsules What is this medication? METOPROLOL (me TOE proe lole) treats high blood pressure and heart failure. It may also be used to prevent chest pain (angina). It works by lowering your blood pressure and heart rate, making it easier for your heart to pump blood to the rest of your body. It belongs to a group of medications called beta blockers. This medicine may be used for other purposes; ask your health care provider or pharmacist if you have questions. COMMON BRAND NAME(S): [facility] What should I tell my care team before I take this medication? They need to know if you have any of these conditions: Diabetes Heart disease Liver disease Lung or breathing disease, like asthma Pheochromocytoma Thyroid disease An unusual or allergic reaction to metoprolol, other beta blockers, drugs, foods, dyes, or preservatives Pregnant or trying to get pregnant Breast-feeding How should I use this medication? Take this medication by mouth with water. Take it as directed on the prescription label at the same time every day. Do not cut, crush, or chew this medication. Swallow the capsules whole. You may open the capsule and put the contents in 1 teaspoon of applesauce. Swallow the medication and applesauce right away. Do not chew the medication or applesauce. Keep taking it unless your care team tells you to stop. Talk to your care team about the use of this medication in children. While it may be prescribed for children as young as 6 years for selected conditions, precautions do apply. Overdosage: If you think you have taken too much of this medicine contact a poison control center or emergency room at once. NOTE: This medicine is only for you. Do not share this medicine with others. What if I miss a dose? If you miss a dose, take it as soon as you can. If it is almost time for your next dose, take only that dose. Do not take double or extra doses. What may interact with this  medication? This medication may interact with the following: Certain medications for blood pressure, heart disease, irregular heartbeat Epinephrine Fluoxetine MAOIs like Carbex, Eldepryl, Marplan, Nardil, and Parnate Paroxetine Reserpine This list may not describe all possible interactions. Give your health care provider a list of all the medicines, herbs, non-prescription drugs, or dietary supplements you use. Also tell them if you smoke, drink alcohol, or use illegal drugs. Some items may interact with your medicine. What should I watch for while using this medication? Visit your care team for regular checks on your progress. Check your blood pressure as directed. Ask your care team what your blood pressure should be. Also, find out when you should contact them. Do not treat yourself for coughs, colds, or pain while you are using this medication without asking your care team for advice. Some medications may increase your blood pressure. You may get drowsy or dizzy. Do not drive, use machinery, or do anything that needs mental alertness until you know how this medication affects you. Do not stand up or sit up quickly, especially if you are an older patient. This reduces the risk of dizzy or fainting spells. Alcohol may interfere with the effect of this medication. Avoid alcoholic drinks. This medication may increase blood sugar. Ask your care team if changes in diet or medications are needed if you have diabetes. What side effects may I notice from receiving this medication? Side effects that you should report to your care team as soon as possible: Allergic reactions--skin rash, itching, hives, swelling of the face, lips,  tongue, or throat Heart failure--shortness of breath, swelling of the ankles, feet, or hands, sudden weight gain, unusual weakness or fatigue Low blood pressure--dizziness, feeling faint or lightheaded, blurry vision Raynaud's--cool, numb, or painful fingers or toes that may  change color from pale, to blue, to red Slow heartbeat--dizziness, feeling faint or lightheaded, confusion, trouble breathing, unusual weakness or fatigue Worsening mood, feelings of depression Side effects that usually do not require medical attention (report to your care team if they continue or are bothersome): Change in sex drive or performance Diarrhea Dizziness Fatigue Headache This list may not describe all possible side effects. Call your doctor for medical advice about side effects. You may report side effects to FDA at 1-800-FDA-1088. Where should I keep my medication? Keep out of the reach of children and pets. Store at room temperature between 20 and 25 degrees C (68 and 77 degrees F). Throw away any unused medication after the expiration date. NOTE: This sheet is a summary. It may not cover all possible information. If you have questions about this medicine, talk to your doctor, pharmacist, or health care provider.  2022 Elsevier/Gold Standard (2021-05-11 00:00:00)   Flexor Carpi Ulnaris and Flexor Carpi Radialis Tendinitis Tendinitis is inflammation in the tissues that connect muscles to bone (tendons). This condition can affect any tendon. Two tendons in the forearm that can be affected by tendinitis are: The flexor carpi ulnaris (FCU). This tendon is located on the pinkie side of the forearm. The flexor carpi radialis (FCR). This tendon is located on the thumb side of the forearm. Tendinitis in either of these tendons will cause wrist or forearm pain. FCU and FCR tendinitis are often caused by overusing the forearm and wrist. What are the causes? Common causes of this condition include: Repeated motions or overuse of the forearm and wrist. Wear and tear from aging. Other causes include: An injury. Too much exercise or strain. Certain antibiotic medicines. In some cases, the cause may not be known. What increases the risk? You are more likely to develop this condition  if: You play sports that involve constantly flexing or stretching the wrist and forearm, such as volleyball, golf, or tennis. You are over 71 years of age. You have certain health conditions, such as: Diabetes. Rheumatoid arthritis. Gout. You have a job that involves flexing the wrist over and over, such as working as a Production designer, theatre/television/film, or Scientist, water quality. What are the signs or symptoms? Symptoms of this condition may develop gradually. Symptoms include: Pain or tenderness in the wrist or forearm. Pain when flexing or stretching the wrist or forearm. Pain when gripping or lifting with the palm of the hand. Swelling in the wrist or forearm. How is this diagnosed? This condition may be diagnosed based on: Your symptoms. Your medical history. A physical exam. During the exam, you may be asked to move your hand, wrist, and arm in certain ways. To rule out other conditions, your health care provider may order tests, such as: MRI. This test provides detailed images of the body's soft tissues and detects tendon tears and inflammation. Ultrasound. This test detects soft tissue injuries, such as tears and inflammation of the ligaments or tendons. How is this treated? This condition may be treated by: Resting the injured area. Applying heat or ice to the wrist or forearm to reduce pain and inflammation. Wearing a splint to keep your wrist and forearm from moving (keep them immobilized) until your symptoms improve. Taking medicine. Your health care provider may prescribe steroids  or other anti-inflammatory medicines, like ibuprofen, to ease your pain and other symptoms. Doing exercises to help you maintain movement and range of motion in your wrist (physical therapy). Having injections of an anti-inflammatory medicine (steroid) if the other treatments are not helping. Having surgery. This is only needed in severe cases. Follow these instructions at home: If you have a splint: Wear it as told by your  health care provider. Remove it only as told by your health care provider. Loosen it if your fingers tingle, become numb, or turn cold and blue. Keep it clean. If the splint is not waterproof: Do not let it get wet. Cover it with a watertight covering when you take a bath or shower. Managing pain, stiffness, and swelling   If directed, put ice on the painful area. If you have a removable splint, remove it as told by your health care provider. Put ice in a plastic bag. Place a towel between your skin and the bag. Leave the ice on for 20 minutes, 2-3 times a day. If directed, apply heat to the affected area as often as told by your health care provider. Use the heat source that your health care provider recommends, such as a moist heat pack or a heating pad. Place a towel between your skin and the heat source. Leave the heat on for 20-30 minutes. Remove the heat if your skin turns bright red. This is especially important if you are unable to feel pain, heat, or cold. You may have a greater risk of getting burned. Move your fingers often to reduce stiffness and swelling. Raise (elevate) the injured area above the level of your heart while you are sitting or lying down. Activity Limit activities that cause your symptoms to get worse or flare up. Do exercises as told by your health care provider. Return to your normal activities as told by your health care provider. Ask your health care provider what activities are safe for you. General instructions Take over-the-counter and prescription medicines only as told by your health care provider. Do not use any products that contain nicotine or tobacco, such as cigarettes, e-cigarettes, and chewing tobacco. These can delay healing. If you need help quitting, ask your health care provider. Keep all follow-up visits as told by your health care provider. This is important. How is this prevented? Warm up and stretch before being active. Cool down and  stretch after being active. Give your body time to rest between periods of activity. Make sure to use equipment that fits you. Be safe and responsible while being active. This will help you to avoid falls. Maintain physical fitness, including: Strength. Flexibility. Cardiovascular fitness. Endurance. Contact a health care provider if: Your pain does not improve. Your pain gets worse. Get help right away if: Your pain is severe. You cannot move your wrist. Summary Flexor carpi ulnaris (FCU) tendinitis and flexor carpi radialis (FCR) tendinitis are conditions that involve inflammation in tendons of the forearm. These conditions cause pain or tenderness in the wrist or forearm. Treatment may include resting the painful area, applying heat or ice, taking medicines, and doing exercises to help you maintain movement and range of motion in your wrist (physical therapy). If you have a splint, wear it as told by your health care provider. This information is not intended to replace advice given to you by your health care provider. Make sure you discuss any questions you have with your health care provider. Document Revised: 12/18/2018 Document Reviewed: 11/09/2018 Elsevier Patient  Education  2022 Elsevier Inc.   Premature Ventricular Contraction A premature ventricular contraction (PVC) is a common kind of irregular heartbeat (arrhythmia). These contractions are extra heartbeats that start in the ventricles of the heart and occur too early in the normal sequence. During the PVC, the heart's normal electrical pathway is not used, so the beat is shorter and less effective. In most cases, these contractions come and go and do not require treatment. What are the causes? Common causes of the condition include: Smoking. Drinking alcohol. Certain medicines. Some illegal drugs. Stress. Caffeine. Certain medical conditions can also cause PVCs: Heart failure. Heart attack, or coronary artery  disease. Heart valve problems. Changes in minerals in the blood (electrolytes). Low blood oxygen levels or high carbon dioxide levels. In many cases, the cause of this condition is not known. What are the signs or symptoms? The main symptom of this condition is fast or skipped heartbeats (palpitations). Other symptoms include: Chest pain. Shortness of breath. Feeling tired. Dizziness. Difficulty exercising. In some cases, there are no symptoms. How is this diagnosed? This condition may be diagnosed based on: Your medical history. A physical exam. During the exam, the health care provider will check for irregular heartbeats. Tests, such as: An ECG (electrocardiogram) to monitor the electrical activity of your heart. An ambulatory cardiac monitor. This device records your heartbeats for 24 hours or more. Stress tests to see how exercise affects your heart rhythm and blood supply. An echocardiogram. This test uses sound waves (ultrasound) to produce an image of your heart. An electrophysiology study (EPS). This test checks for electrical problems in your heart. How is this treated? Treatment for this condition depends on any underlying conditions, the type of PVCs that you are having, and how much the symptoms are interfering with your daily life. Possible treatments include: Avoiding things that cause premature contractions (triggers). These include caffeine and alcohol. Taking medicines if symptoms are severe or if the extra heartbeats are frequent. Getting treatment for underlying conditions that cause PVCs. Having an implantable cardioverter defibrillator (ICD), if you are at risk for a serious arrhythmia. The ICD is a small device that is inserted into your chest to monitor your heartbeat. When it senses an irregular heartbeat, it sends a shock to bring the heartbeat back to normal. Having a procedure to destroy the portion of the heart tissue that sends out abnormal signals (catheter  ablation). In some cases, no treatment is required. Follow these instructions at home: Lifestyle Do not use any products that contain nicotine or tobacco, such as cigarettes, e-cigarettes, and chewing tobacco. If you need help quitting, ask your health care provider. Do not use illegal drugs. Exercise regularly. Ask your health care provider what type of exercise is safe for you. Try to get at least 7-9 hours of sleep each night, or as much as recommended by your health care provider. Find healthy ways to manage stress. Avoid stressful situations when possible. Alcohol use Do not drink alcohol if: Your health care provider tells you not to drink. You are pregnant, may be pregnant, or are planning to become pregnant. Alcohol triggers your episodes. If you drink alcohol: Limit how much you use to: 0-1 drink a day for women. 0-2 drinks a day for men. Be aware of how much alcohol is in your drink. In the [place] one drink equals one 12 oz bottle of beer (355 mL), one 5 oz glass of wine (148 mL), or one 1 oz glass of hard liquor (44  mL). General instructions Take over-the-counter and prescription medicines only as told by your health care provider. If caffeine triggers episodes of PVC, do not eat, drink, or use anything with caffeine in it. Keep all follow-up visits as told by your health care provider. This is important. Contact a health care provider if you: Feel palpitations. Get help right away if you: Have chest pain. Have shortness of breath. Have sweating for no reason. Have nausea and vomiting. Become light-headed or you faint. Summary A premature ventricular contraction (PVC) is a common kind of irregular heartbeat (arrhythmia). In most cases, these contractions come and go and do not require treatment. You may need to wear an ambulatory cardiac monitor. This records your heartbeats for 24 hours or more. Treatment depends on any underlying conditions, the type of PVCs that you  are having, and how much the symptoms are interfering with your daily life. This information is not intended to replace advice given to you by your health care provider. Make sure you discuss any questions you have with your health care provider. Document Revised: 01/19/2021 Document Reviewed: 01/19/2021 Elsevier Patient Education  [place].

## 2021-10-17 ENCOUNTER — Encounter: Payer: Self-pay | Admitting: Physician Assistant

## 2021-10-20 ENCOUNTER — Encounter: Payer: Self-pay | Admitting: Physician Assistant

## 2021-10-20 DIAGNOSIS — R002 Palpitations: Secondary | ICD-10-CM | POA: Insufficient documentation

## 2021-10-20 DIAGNOSIS — R052 Subacute cough: Secondary | ICD-10-CM | POA: Insufficient documentation

## 2021-10-20 DIAGNOSIS — M25531 Pain in right wrist: Secondary | ICD-10-CM | POA: Insufficient documentation

## 2021-10-20 DIAGNOSIS — Z8616 Personal history of COVID-19: Secondary | ICD-10-CM | POA: Insufficient documentation

## 2021-11-01 MED ORDER — DEXAMETHASONE 4 MG PO TABS: 4.0000 mg | ORAL_TABLET | Freq: Two times a day (BID) | ORAL | 0 refills | Status: DC

## 2021-11-05 ENCOUNTER — Encounter: Payer: Self-pay | Admitting: Physician Assistant

## 2021-11-05 DIAGNOSIS — R002 Palpitations: Secondary | ICD-10-CM

## 2021-11-05 DIAGNOSIS — Z8616 Personal history of COVID-19: Secondary | ICD-10-CM

## 2021-11-06 ENCOUNTER — Other Ambulatory Visit: Payer: Self-pay | Admitting: Physician Assistant

## 2021-11-06 DIAGNOSIS — R002 Palpitations: Secondary | ICD-10-CM

## 2021-11-22 ENCOUNTER — Other Ambulatory Visit: Payer: Self-pay | Admitting: Physician Assistant

## 2021-11-22 DIAGNOSIS — R002 Palpitations: Secondary | ICD-10-CM

## 2021-11-29 ENCOUNTER — Telehealth: Payer: Self-pay | Admitting: *Deleted

## 2021-11-29 ENCOUNTER — Encounter: Payer: Self-pay | Admitting: Physician Assistant

## 2021-11-29 NOTE — Telephone Encounter (Signed)
Left message for pt to call us back to schedule monitor appt for 7 day zio for palpitations [facility] [place] [facility] Wed or Thurs of this week. Those are only days we will be there this week. Did not put order in yet depending on if pt can come this week and would order under Dr. Geraldo Pitter.  ?

## 2021-11-30 ENCOUNTER — Ambulatory Visit (INDEPENDENT_AMBULATORY_CARE_PROVIDER_SITE_OTHER): Payer: BC Managed Care – PPO

## 2021-11-30 DIAGNOSIS — R002 Palpitations: Secondary | ICD-10-CM

## 2021-11-30 DIAGNOSIS — Z8616 Personal history of COVID-19: Secondary | ICD-10-CM

## 2021-12-05 DIAGNOSIS — Z8616 Personal history of COVID-19: Secondary | ICD-10-CM

## 2021-12-05 DIAGNOSIS — R002 Palpitations: Secondary | ICD-10-CM | POA: Diagnosis not present

## 2021-12-07 ENCOUNTER — Encounter: Payer: Self-pay | Admitting: Physician Assistant

## 2021-12-20 DIAGNOSIS — R002 Palpitations: Secondary | ICD-10-CM | POA: Diagnosis not present

## 2021-12-29 NOTE — Progress Notes (Signed)
Most of the time you are in sinus rhythm.  ?1 run of SVT.  ?You 1 episode of extra ventricular beat ?2 episodes of supraventricular extra beat ? ?Overall looks pretty good. I do think staying on metoprolol is a good idea if you are symptomatic.  ? ?If these beats were happening more often there might be indication to treat more aggressively.

## 2021-12-30 ENCOUNTER — Encounter: Payer: Self-pay | Admitting: Physician Assistant

## 2021-12-30 DIAGNOSIS — R002 Palpitations: Secondary | ICD-10-CM

## 2021-12-31 MED ORDER — METOPROLOL SUCCINATE ER 25 MG PO TB24: 12.5000 mg | ORAL_TABLET | Freq: Every day | ORAL | 0 refills | Status: DC

## 2022-01-03 IMAGING — MG MM DIGITAL DIAGNOSTIC UNILAT*L* W/ TOMO W/ CAD
4 series · 4 of 12 positions shown · non-contrast
Comparison: Previous exam(s).

CLINICAL DATA: 60-year-old female for further evaluation of
possible LEFT breast distortion on screening mammogram.

EXAM:
DIGITAL DIAGNOSTIC UNILATERAL LEFT MAMMOGRAM WITH TOMOSYNTHESIS AND
CAD; ULTRASOUND LEFT BREAST LIMITED
TECHNIQUE: Left digital diagnostic mammography and breast tomosynthesis was
performed. The images were evaluated with computer-aided detection.;
Targeted ultrasound examination of the left breast was performed

[L CC synth-2D]
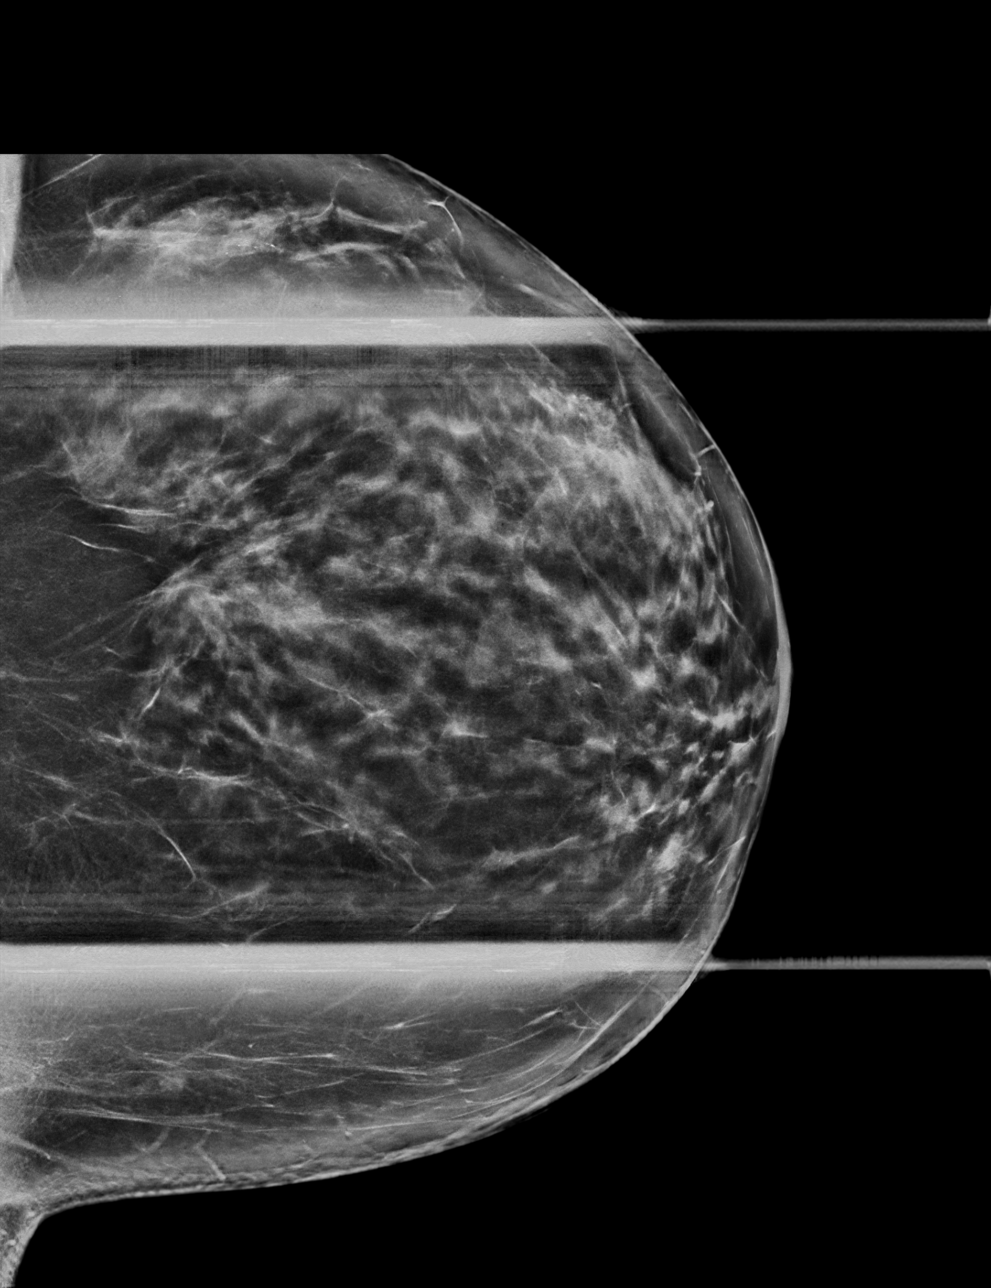

[L MLO synth-2D]
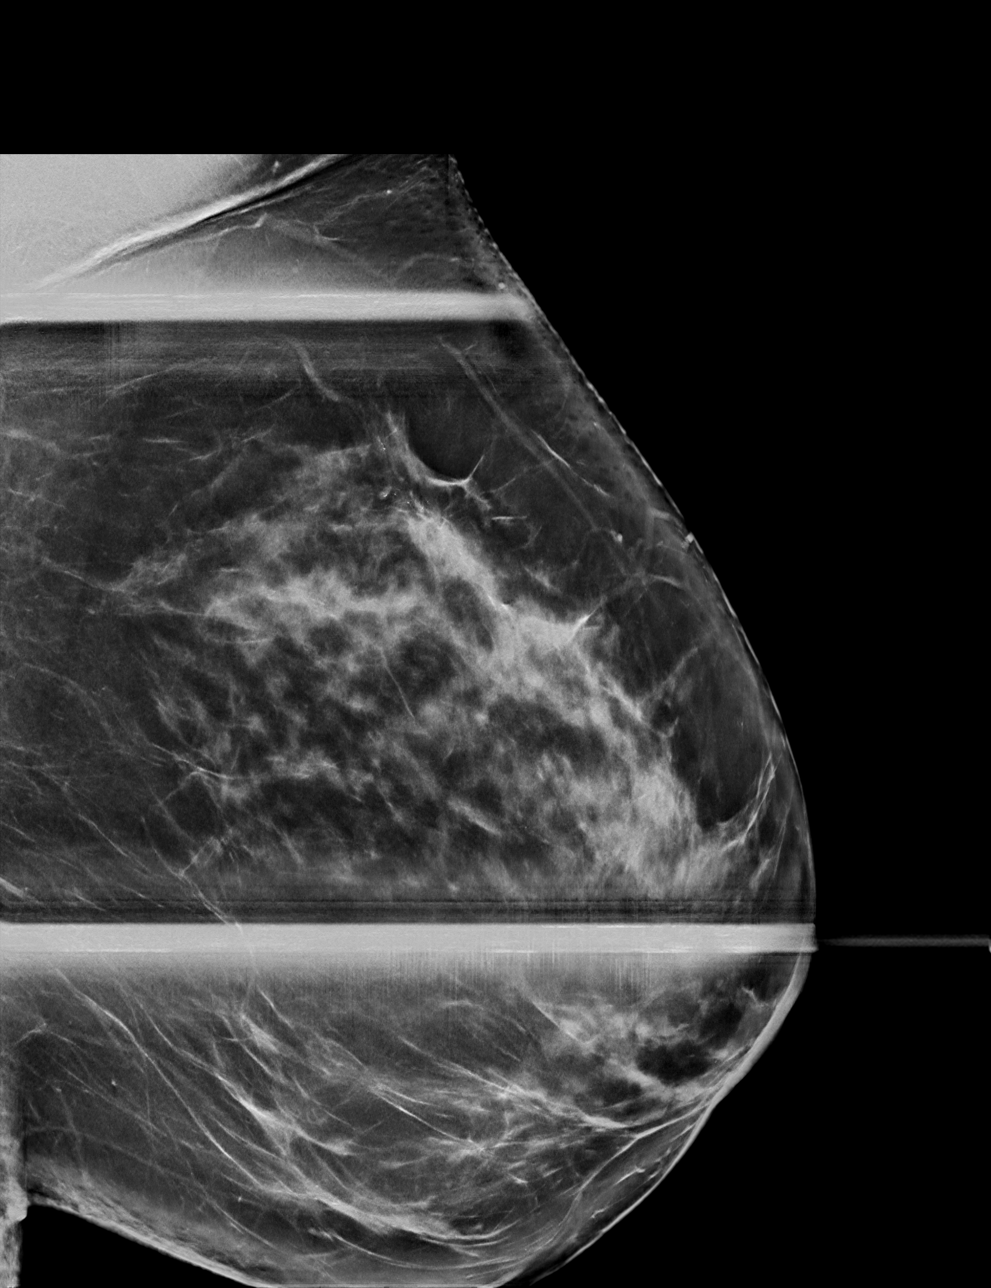

[L MLO tomo · tomo slice 43/86.0]
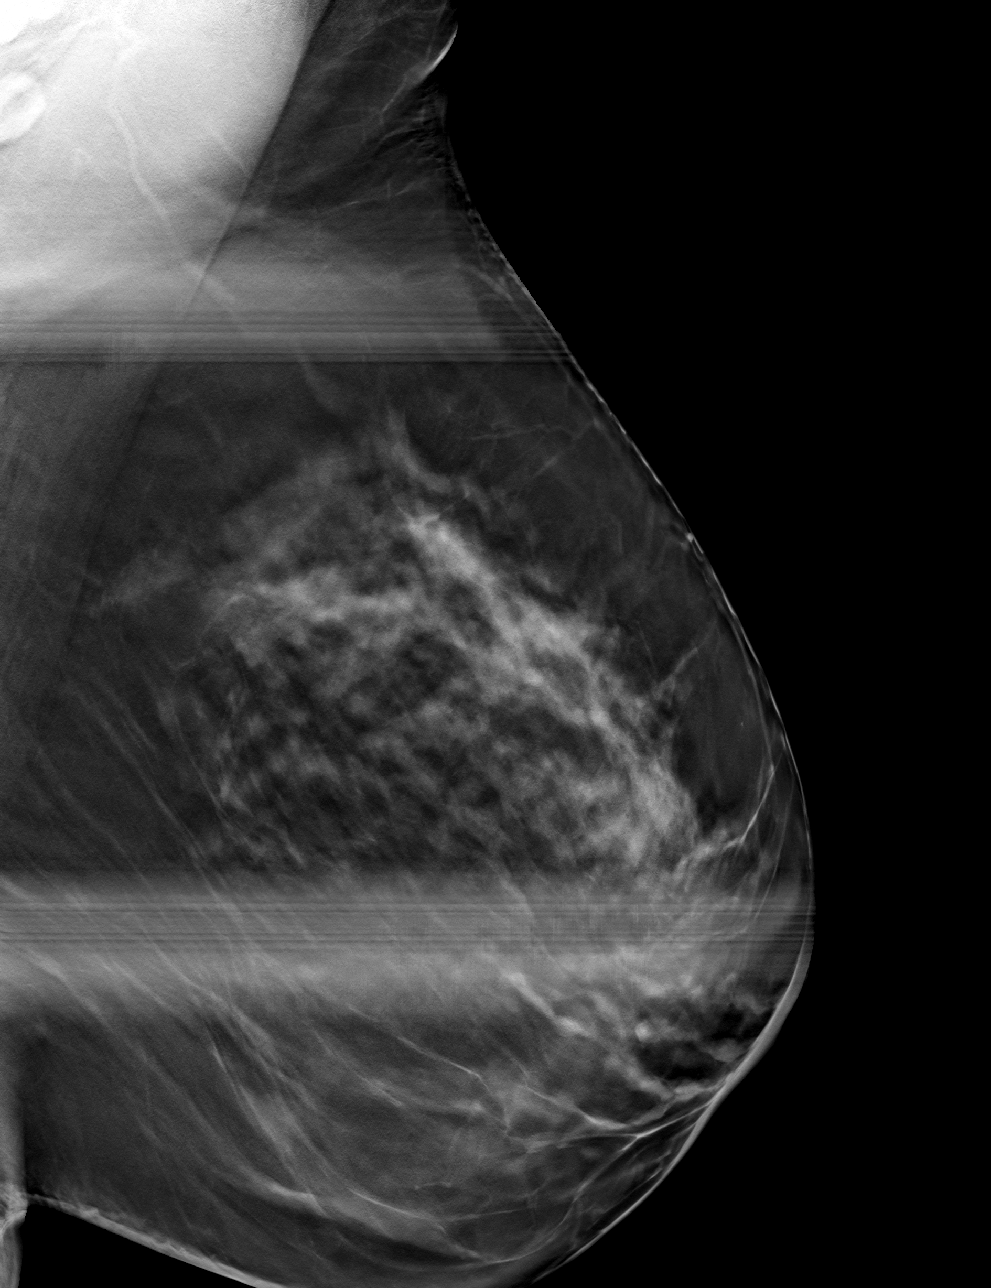

[L CC tomo · tomo slice 41/82.0]
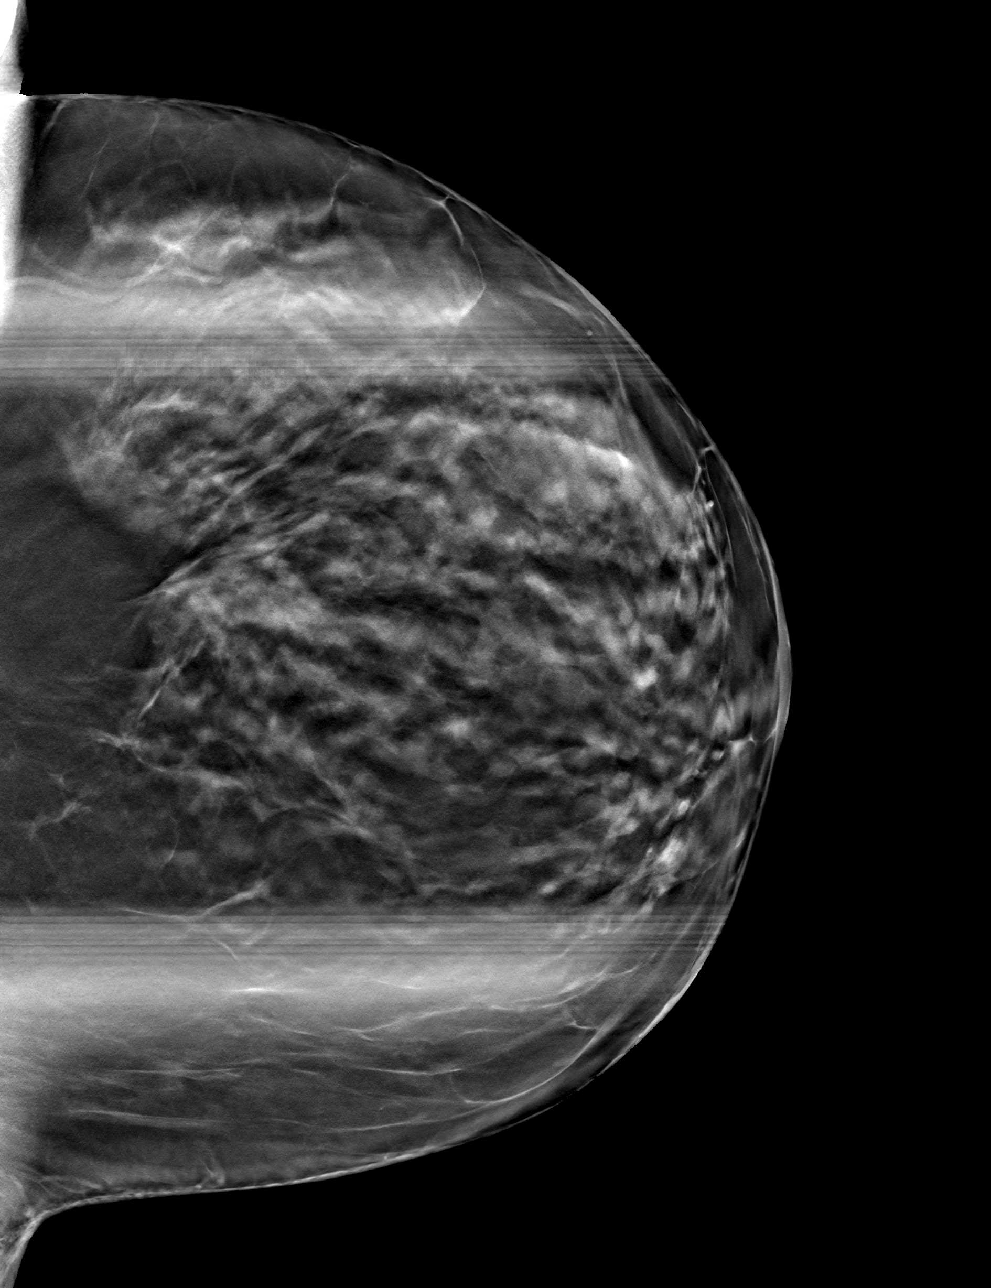

[4 of 12 positions shown; findings below may reference images not displayed]

ACR Breast Density Category c: The breast tissue is heterogeneously
dense, which may obscure small masses.
FINDINGS: 2D/3D spot compression views of the LEFT breast demonstrate possible
distortion within the posterior central LEFT breast only well
identified on the CC view.

Targeted ultrasound is performed, showing no sonographic abnormality
within the LEFT breast, in the area of the possible distortion.

No abnormal LEFT axillary lymph nodes are noted.
IMPRESSION: 1. Possible LEFT breast distortion without sonographic correlate.
Tissue sampling is recommended.
2. No abnormal appearing LEFT axillary lymph nodes.

RECOMMENDATION:
3D/stereotactic guided LEFT breast biopsy, which will be scheduled.

I have discussed the findings and recommendations with the patient.
If applicable, a reminder letter will be sent to the patient
regarding the next appointment.

BI-RADS CATEGORY  4: Suspicious.

## 2022-01-03 IMAGING — US US BREAST*L* LIMITED INC AXILLA
1 series · 10 of 10 positions shown · non-contrast
Comparison: Previous exam(s).

CLINICAL DATA: 60-year-old female for further evaluation of
possible LEFT breast distortion on screening mammogram.

EXAM:
DIGITAL DIAGNOSTIC UNILATERAL LEFT MAMMOGRAM WITH TOMOSYNTHESIS AND
CAD; ULTRASOUND LEFT BREAST LIMITED
TECHNIQUE: Left digital diagnostic mammography and breast tomosynthesis was
performed. The images were evaluated with computer-aided detection.;
Targeted ultrasound examination of the left breast was performed

[Series 1: us breast*left* limited inc axilla · 0.08mm/px · 10 of 10 slices shown]
[im 1/10]
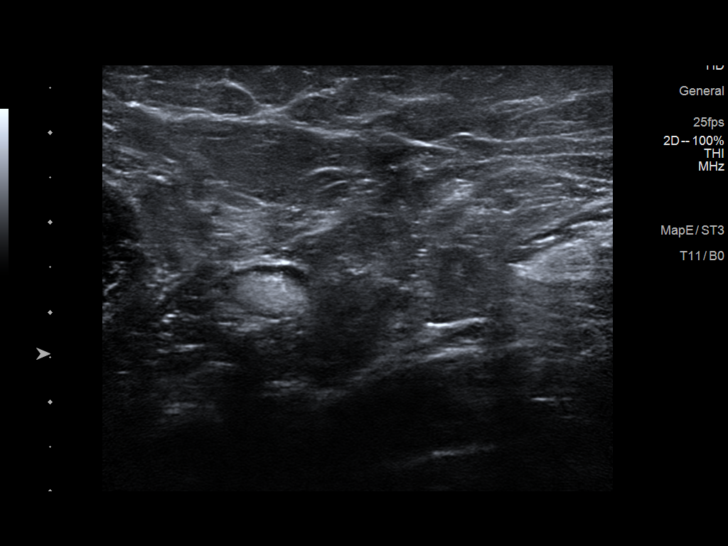
[im 2/10]
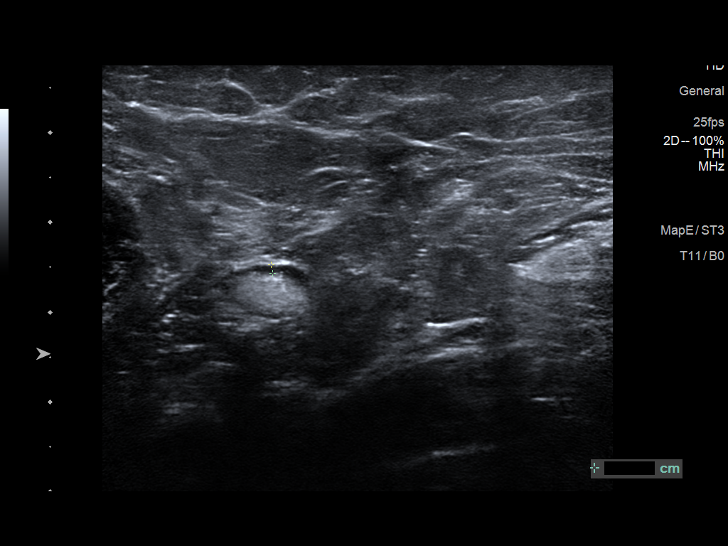
[im 3/10]
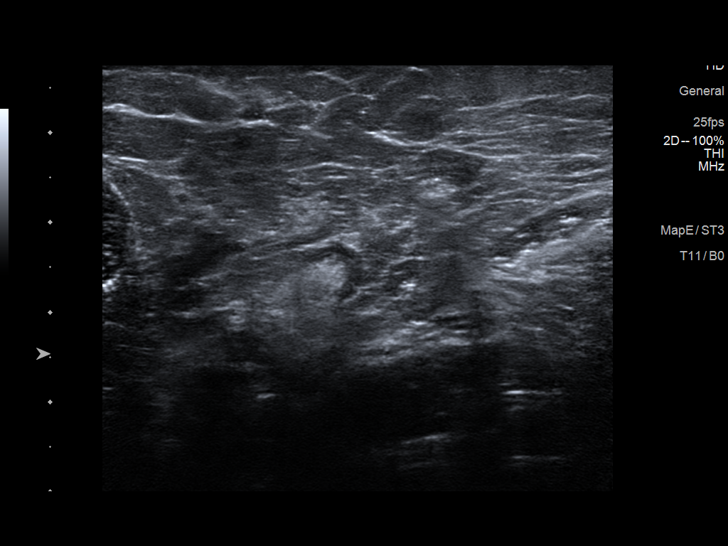
[im 4/10]
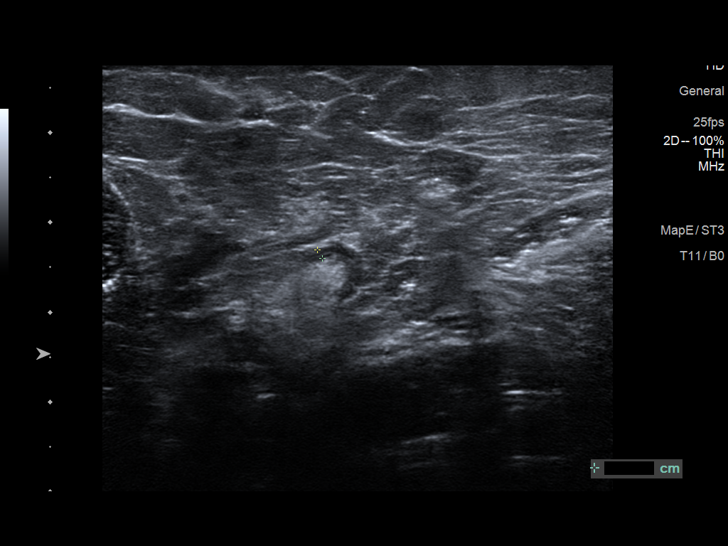
[im 5/10]
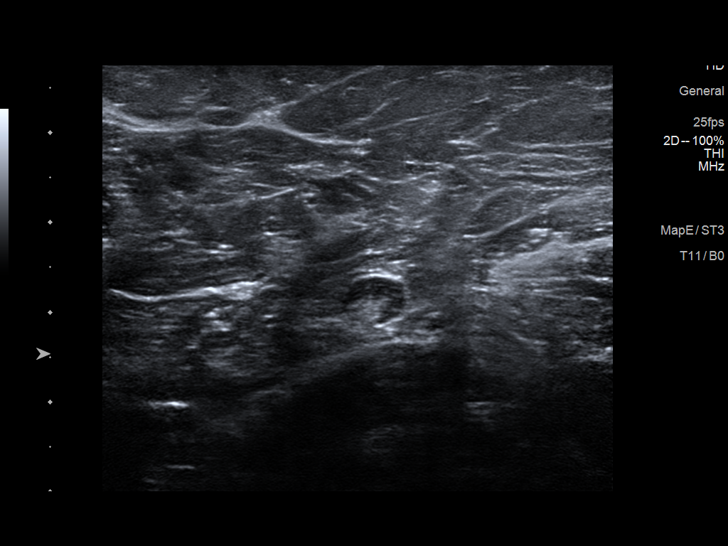
[im 6/10]
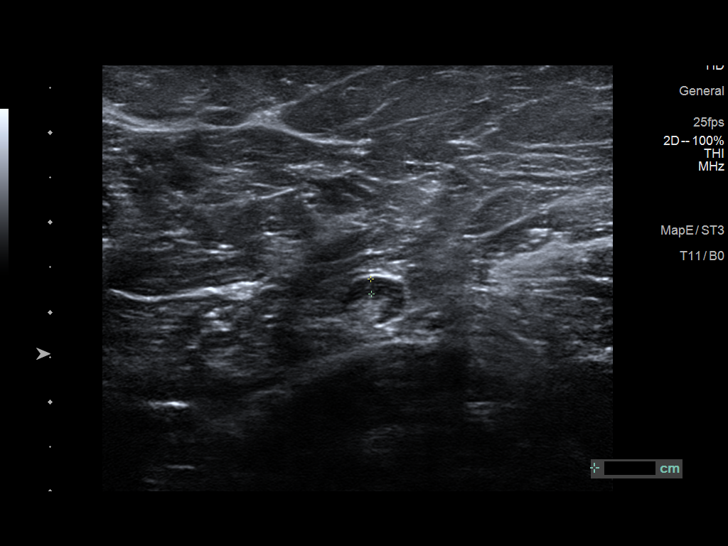
[im 7/10]
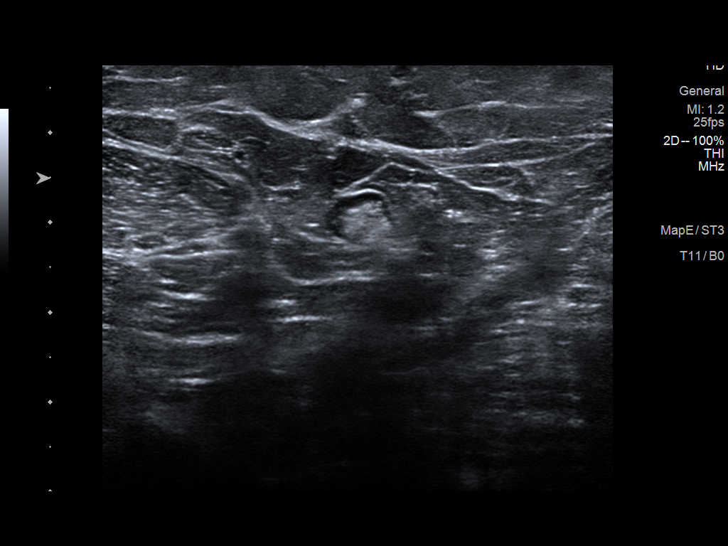
[im 8/10]
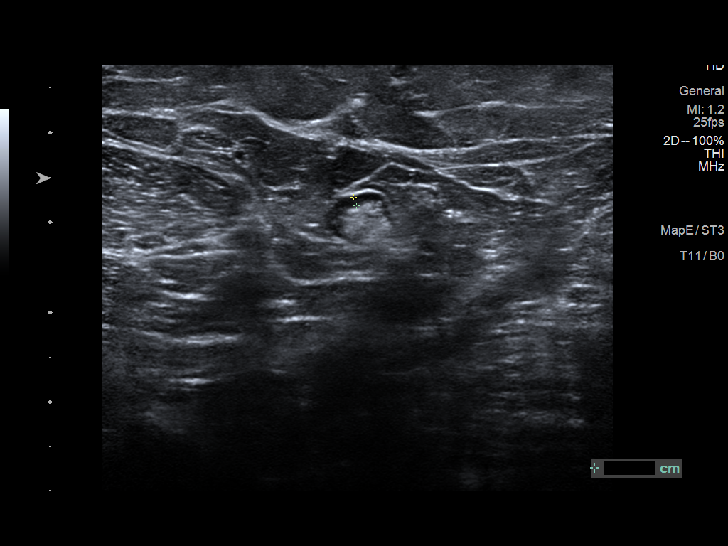
[im 9/10]
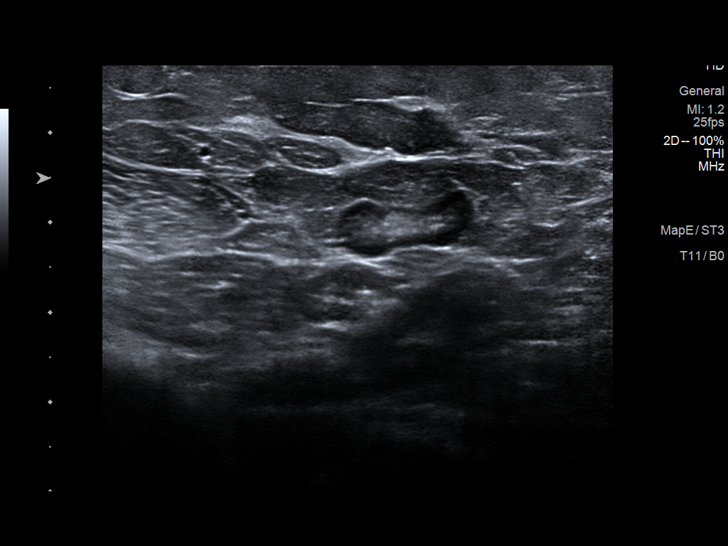
[im 10/10]
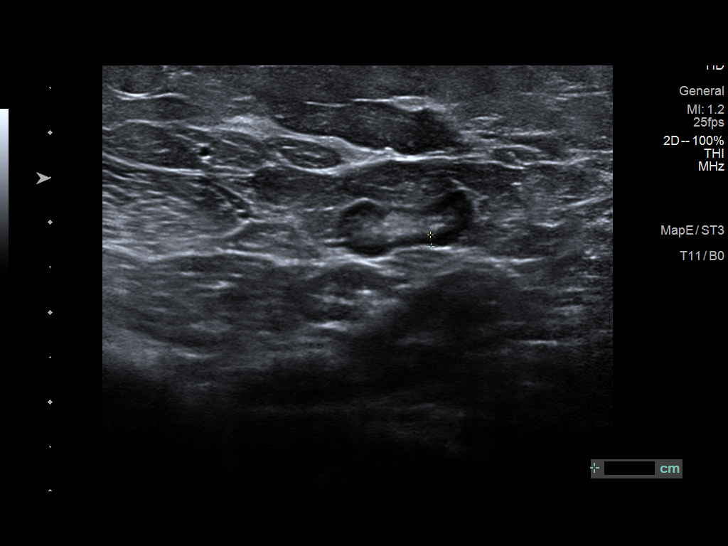

[10 of 10 positions shown; findings below may reference images not displayed]

ACR Breast Density Category c: The breast tissue is heterogeneously
dense, which may obscure small masses.
FINDINGS: 2D/3D spot compression views of the LEFT breast demonstrate possible
distortion within the posterior central LEFT breast only well
identified on the CC view.

Targeted ultrasound is performed, showing no sonographic abnormality
within the LEFT breast, in the area of the possible distortion.

No abnormal LEFT axillary lymph nodes are noted.
IMPRESSION: 1. Possible LEFT breast distortion without sonographic correlate.
Tissue sampling is recommended.
2. No abnormal appearing LEFT axillary lymph nodes.

RECOMMENDATION:
3D/stereotactic guided LEFT breast biopsy, which will be scheduled.

I have discussed the findings and recommendations with the patient.
If applicable, a reminder letter will be sent to the patient
regarding the next appointment.

BI-RADS CATEGORY  4: Suspicious.

## 2022-02-22 ENCOUNTER — Other Ambulatory Visit: Payer: Self-pay | Admitting: Physician Assistant

## 2022-02-22 DIAGNOSIS — R002 Palpitations: Secondary | ICD-10-CM

## 2022-03-04 ENCOUNTER — Ambulatory Visit (INDEPENDENT_AMBULATORY_CARE_PROVIDER_SITE_OTHER): Payer: BC Managed Care – PPO | Admitting: Physician Assistant

## 2022-03-04 VITALS — BP 130/80 | HR 94 | Ht 64.0 in | Wt 162.0 lb

## 2022-03-04 DIAGNOSIS — H9312 Tinnitus, left ear: Secondary | ICD-10-CM | POA: Diagnosis not present

## 2022-03-04 DIAGNOSIS — M25571 Pain in right ankle and joints of right foot: Secondary | ICD-10-CM | POA: Diagnosis not present

## 2022-03-04 DIAGNOSIS — R002 Palpitations: Secondary | ICD-10-CM

## 2022-03-04 DIAGNOSIS — F900 Attention-deficit hyperactivity disorder, predominantly inattentive type: Secondary | ICD-10-CM

## 2022-03-04 DIAGNOSIS — H6983 Other specified disorders of Eustachian tube, bilateral: Secondary | ICD-10-CM | POA: Diagnosis not present

## 2022-03-04 DIAGNOSIS — M7741 Metatarsalgia, right foot: Secondary | ICD-10-CM

## 2022-03-04 MED ORDER — AMPHETAMINE-DEXTROAMPHET ER 10 MG PO CP24: 10.0000 mg | ORAL_CAPSULE | Freq: Every day | ORAL | 0 refills | Status: DC

## 2022-03-04 MED ORDER — METHYLPREDNISOLONE 4 MG PO TBPK: ORAL_TABLET | ORAL | 0 refills | Status: DC

## 2022-03-04 NOTE — Progress Notes (Signed)
   Established Patient Office Visit  Subjective   Patient ID: Teresa Taylor, female    DOB: 1960-10-04  Age: 61 y.o. MRN: [id number]  Chief Complaint  Patient presents with  . Palpitations    HPI  Ringing left ear Palpitations-better Flonase did not work  Breast care recommend  Weird thing at bed right lateral hankle hurts at night fine all day no number and tingling.    {History (Optional):23778}  ROS    Objective:     BP 130/80   Pulse 94   Ht 5\' 4"  (1.626 m)   Wt 162 lb (73.5 kg)   SpO2 99%   BMI 27.81 kg/m  BP Readings from Last 3 Encounters:  03/04/22 130/80  10/15/21 (!) 148/67  05/28/21 (!) 144/71   Wt Readings from Last 3 Encounters:  03/04/22 162 lb (73.5 kg)  10/15/21 162 lb (73.5 kg)  05/28/21 160 lb (72.6 kg)      Physical Exam      EKG- NSR with no ST elevation or depression.  Assessment & Plan:    No follow-ups on file.    05/30/21, PA-C

## 2022-03-04 NOTE — Patient Instructions (Addendum)
Fluticisone and start medrol dose pack Referral to podiatry  Eustachian Tube Dysfunction  Eustachian tube dysfunction refers to a condition in which a blockage develops in the narrow passage that connects the middle ear to the back of the nose (eustachian tube). The eustachian tube regulates air pressure in the middle ear by letting air move between the ear and nose. It also helps to drain fluid from the middle ear space. Eustachian tube dysfunction can affect one or both ears. When the eustachian tube does not function properly, air pressure, fluid, or both can build up in the middle ear. What are the causes? This condition occurs when the eustachian tube becomes blocked or cannot open normally. Common causes of this condition include: Ear infections. Colds and other infections that affect the nose, mouth, and throat (upper respiratory tract). Allergies. Irritation from cigarette smoke. Irritation from stomach acid coming up into the esophagus (gastroesophageal reflux). The esophagus is the part of the body that moves food from the mouth to the stomach. Sudden changes in air pressure, such as from descending in an airplane or scuba diving. Abnormal growths in the nose or throat, such as: Growths that line the nose (nasal polyps). Abnormal growth of cells (tumors). Enlarged tissue at the back of the throat (adenoids). What increases the risk? You are more likely to develop this condition if: You smoke. You are overweight. You are a child who has: Certain birth defects of the mouth, such as cleft palate. Large tonsils or adenoids. What are the signs or symptoms? Common symptoms of this condition include: A feeling of fullness in the ear. Ear pain. Clicking or popping noises in the ear. Ringing in the ear (tinnitus). Hearing loss. Loss of balance. Dizziness. Symptoms may get worse when the air pressure around you changes, such as when you travel to an area of high elevation, fly on  an airplane, or go scuba diving. How is this diagnosed? This condition may be diagnosed based on: Your symptoms. A physical exam of your ears, nose, and throat. Tests, such as those that measure: The movement of your eardrum. Your hearing (audiometry). How is this treated? Treatment depends on the cause and severity of your condition. In mild cases, you may relieve your symptoms by moving air into your ears. This is called "popping the ears." In more severe cases, or if you have symptoms of fluid in your ears, treatment may include: Medicines to relieve congestion (decongestants). Medicines that treat allergies (antihistamines). Nasal sprays or ear drops that contain medicines that reduce swelling (steroids). A procedure to drain the fluid in your eardrum. In this procedure, a small tube may be placed in the eardrum to: Drain the fluid. Restore the air in the middle ear space. A procedure to insert a balloon device through the nose to inflate the opening of the eustachian tube (balloon dilation). Follow these instructions at home: Lifestyle Do not do any of the following until your health care provider approves: Travel to high altitudes. Fly in airplanes. Work in a Estate agent or room. Scuba dive. Do not use any products that contain nicotine or tobacco. These products include cigarettes, chewing tobacco, and vaping devices, such as e-cigarettes. If you need help quitting, ask your health care provider. Keep your ears dry. Wear fitted earplugs during showering and bathing. Dry your ears completely after. General instructions Take over-the-counter and prescription medicines only as told by your health care provider. Use techniques to help pop your ears as recommended by your health care  provider. These may include: Chewing gum. Yawning. Frequent, forceful swallowing. Closing your mouth, holding your nose closed, and gently blowing as if you are trying to blow air out of your  nose. Keep all follow-up visits. This is important. Contact a health care provider if: Your symptoms do not go away after treatment. Your symptoms come back after treatment. You are unable to pop your ears. You have: A fever. Pain in your ear. Pain in your head or neck. Fluid draining from your ear. Your hearing suddenly changes. You become very dizzy. You lose your balance. Get help right away if: You have a sudden, severe increase in any of your symptoms. Summary Eustachian tube dysfunction refers to a condition in which a blockage develops in the eustachian tube. It can be caused by ear infections, allergies, inhaled irritants, or abnormal growths in the nose or throat. Symptoms may include ear pain or fullness, hearing loss, or ringing in the ears. Mild cases are treated with techniques to unblock the ears, such as yawning or chewing gum. More severe cases are treated with medicines or procedures. This information is not intended to replace advice given to you by your health care provider. Make sure you discuss any questions you have with your health care provider. Document Revised: 11/02/2020 Document Reviewed: 11/02/2020 Elsevier Patient Education  [employer].

## 2022-03-09 ENCOUNTER — Telehealth: Payer: Self-pay | Admitting: Neurology

## 2022-03-09 DIAGNOSIS — H9312 Tinnitus, left ear: Secondary | ICD-10-CM

## 2022-03-09 NOTE — Telephone Encounter (Signed)
Patient left vm stating pharmacy did not get her Adderall.   Also she finished medication and fluid in ears seem to be better, still having ringing in her ears.

## 2022-03-09 NOTE — Telephone Encounter (Signed)
Called pharmacy, they have Adderall RX but do not have in stock - on back order. Will let patient know when I call her back.    Please advise next steps for ringing in the ear?

## 2022-03-10 NOTE — Telephone Encounter (Signed)
Patient made aware of advise. She would like to try Adderall IR. Please send to pharmacy.

## 2022-03-11 MED ORDER — AMPHETAMINE-DEXTROAMPHETAMINE 10 MG PO TABS: 10.0000 mg | ORAL_TABLET | Freq: Two times a day (BID) | ORAL | 0 refills | Status: DC

## 2022-03-11 NOTE — Telephone Encounter (Signed)
Sent!

## 2022-03-14 ENCOUNTER — Encounter: Payer: Self-pay | Admitting: Physician Assistant

## 2022-03-14 DIAGNOSIS — H6993 Unspecified Eustachian tube disorder, bilateral: Secondary | ICD-10-CM | POA: Insufficient documentation

## 2022-03-14 DIAGNOSIS — H6983 Other specified disorders of Eustachian tube, bilateral: Secondary | ICD-10-CM | POA: Insufficient documentation

## 2022-03-14 DIAGNOSIS — H9312 Tinnitus, left ear: Secondary | ICD-10-CM | POA: Insufficient documentation

## 2022-03-14 DIAGNOSIS — M7741 Metatarsalgia, right foot: Secondary | ICD-10-CM | POA: Insufficient documentation

## 2022-03-25 ENCOUNTER — Ambulatory Visit (INDEPENDENT_AMBULATORY_CARE_PROVIDER_SITE_OTHER): Payer: BC Managed Care – PPO

## 2022-03-25 ENCOUNTER — Encounter: Payer: Self-pay | Admitting: Podiatry

## 2022-03-25 ENCOUNTER — Ambulatory Visit (INDEPENDENT_AMBULATORY_CARE_PROVIDER_SITE_OTHER): Payer: BC Managed Care – PPO | Admitting: Podiatry

## 2022-03-25 DIAGNOSIS — M7989 Other specified soft tissue disorders: Secondary | ICD-10-CM | POA: Diagnosis not present

## 2022-03-25 DIAGNOSIS — M2011 Hallux valgus (acquired), right foot: Secondary | ICD-10-CM

## 2022-03-25 DIAGNOSIS — M79671 Pain in right foot: Secondary | ICD-10-CM

## 2022-03-25 DIAGNOSIS — M7751 Other enthesopathy of right foot: Secondary | ICD-10-CM

## 2022-03-25 DIAGNOSIS — M19071 Primary osteoarthritis, right ankle and foot: Secondary | ICD-10-CM | POA: Diagnosis not present

## 2022-03-25 DIAGNOSIS — M25571 Pain in right ankle and joints of right foot: Secondary | ICD-10-CM

## 2022-03-25 NOTE — Progress Notes (Signed)
  Subjective:  Patient ID: Teresa Taylor, female    DOB: 11-21-1960,   MRN: [id number]  No chief complaint on file.   61 y.o. female presents for concern of right foot pain that has been ongoing for about 2-3 years. Relates she has bunions on both sides but the right is worse than the left. She relates occasional redness and swelling and some pain in the ball of her foot. Denies any treatments. Denies diabetes.. Denies any other pedal complaints. Denies n/v/f/c.   Past Medical History:  Diagnosis Date   Allergy     Objective:  Physical Exam: Vascular: DP/PT pulses 2/4 bilateral. CFT <3 seconds. Normal hair growth on digits. No edema.  Skin. No lacerations or abrasions bilateral feet.  Musculoskeletal: MMT 5/5 bilateral lower extremities in DF, PF, Inversion and Eversion. Deceased ROM in DF of ankle joint. No tenderness over peroneal tendons some tenderness to sinus tarsi area. Mild pes planus noted. 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 Some pain to plantar second metatarsophalangeal joint.  Neurological: Sensation intact to light touch.   Assessment:   1. Right foot pain   2. Right ankle pain, unspecified chronicity      Plan:  Patient was evaluated and treated and all questions answered. X-rays reviewed and discussed with patient. No acute fractures or dislocations noted.  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  -Discussed HAV and treatment options;conservative and surgical management; risks, benefits, alternatives discussed. All patient's questions answered. -Discussed padding and wide shoe gear.   -Recommend continue with good supportive shoes and inserts.  -Discussed custom orthotics. Patient will call back at the beginning of the year to get fitted for CMO.  -Discussed swelling and capsulitis of the ankle. Discussed custom orthotics helping prevent any problems here as well.  -Patient to  return to office as needed or sooner if condition worsens.   Louann Sjogren, DPM

## 2022-04-25 ENCOUNTER — Other Ambulatory Visit: Payer: Self-pay | Admitting: Physician Assistant

## 2022-04-25 DIAGNOSIS — R002 Palpitations: Secondary | ICD-10-CM

## 2022-05-17 DIAGNOSIS — H903 Sensorineural hearing loss, bilateral: Secondary | ICD-10-CM | POA: Diagnosis not present

## 2022-05-17 DIAGNOSIS — H93A2 Pulsatile tinnitus, left ear: Secondary | ICD-10-CM | POA: Diagnosis not present

## 2022-05-17 DIAGNOSIS — R42 Dizziness and giddiness: Secondary | ICD-10-CM | POA: Diagnosis not present

## 2022-05-17 DIAGNOSIS — H6990 Unspecified Eustachian tube disorder, unspecified ear: Secondary | ICD-10-CM | POA: Diagnosis not present

## 2022-07-05 ENCOUNTER — Ambulatory Visit (INDEPENDENT_AMBULATORY_CARE_PROVIDER_SITE_OTHER): Payer: BC Managed Care – PPO | Admitting: Physician Assistant

## 2022-07-05 VITALS — BP 139/69 | HR 80 | Ht 64.0 in | Wt 160.0 lb

## 2022-07-05 DIAGNOSIS — Z13220 Encounter for screening for lipoid disorders: Secondary | ICD-10-CM | POA: Diagnosis not present

## 2022-07-05 DIAGNOSIS — Z0000 Encounter for general adult medical examination without abnormal findings: Secondary | ICD-10-CM | POA: Diagnosis not present

## 2022-07-05 DIAGNOSIS — Z23 Encounter for immunization: Secondary | ICD-10-CM | POA: Diagnosis not present

## 2022-07-05 DIAGNOSIS — F900 Attention-deficit hyperactivity disorder, predominantly inattentive type: Secondary | ICD-10-CM

## 2022-07-05 DIAGNOSIS — E559 Vitamin D deficiency, unspecified: Secondary | ICD-10-CM

## 2022-07-05 DIAGNOSIS — Z131 Encounter for screening for diabetes mellitus: Secondary | ICD-10-CM

## 2022-07-05 DIAGNOSIS — Z1329 Encounter for screening for other suspected endocrine disorder: Secondary | ICD-10-CM

## 2022-07-05 DIAGNOSIS — R7309 Other abnormal glucose: Secondary | ICD-10-CM

## 2022-07-05 DIAGNOSIS — E663 Overweight: Secondary | ICD-10-CM

## 2022-07-05 MED ORDER — VILAZODONE HCL 10 MG PO TABS: 10.0000 mg | ORAL_TABLET | Freq: Every day | ORAL | 3 refills | Status: DC

## 2022-07-05 MED ORDER — AMPHETAMINE-DEXTROAMPHETAMINE 10 MG PO TABS: 10.0000 mg | ORAL_TABLET | Freq: Two times a day (BID) | ORAL | 0 refills | Status: DC

## 2022-07-05 NOTE — Progress Notes (Signed)
Complete physical exam  Patient: Teresa Taylor   DOB: [birth year]   61 y.o. Female  MRN: [id number]  Subjective:    Chief Complaint  Patient presents with  . Annual Exam    Teresa Taylor is a 61 y.o. female who presents today for a complete physical exam. She reports consuming a {diet types:17450} diet. {types:19826} She generally feels {DESC; WELL/FAIRLY WELL/POORLY:18703}. She reports sleeping {DESC; WELL/FAIRLY WELL/POORLY:18703}. She {does/does not:200015} have additional problems to discuss today.    Most recent fall risk assessment:    07/05/2022    2:20 PM  Fall Risk   Falls in the past year? 0  Number falls in past yr: 0  Injury with Fall? 0  Risk for fall due to : No Fall Risks  Follow up Falls evaluation completed     Most recent depression screenings:    07/05/2022    2:26 PM 03/04/2022   10:52 AM  PHQ 2/9 Scores  PHQ - 2 Score 4 2  PHQ- 9 Score 13     {VISON DENTAL STD PSA (Optional):27386}  {History (Optional):23778}  Patient Care Team: Nolene Ebbs as PCP - General (Family Medicine)   Outpatient Medications Prior to Visit  Medication Sig  . amphetamine-dextroamphetamine (ADDERALL XR) 10 MG 24 hr capsule Take 1 capsule (10 mg total) by mouth daily. (Patient not taking: Reported on 07/05/2022)  . amphetamine-dextroamphetamine (ADDERALL) 10 MG tablet Take 1 tablet (10 mg total) by mouth 2 (two) times daily with a meal. (Patient not taking: Reported on 07/05/2022)   No facility-administered medications prior to visit.    ROS        Objective:     BP 139/69   Pulse 80   Ht 5\' 4"  (1.626 m)   Wt 160 lb (72.6 kg)   SpO2 98%   BMI 27.46 kg/m  {Vitals History (Optional):23777}  Physical Exam   No results found for any visits on 07/05/22. {Show previous labs (optional):23779}    Assessment & Plan:    Routine Health Maintenance and Physical Exam  Immunization History  Administered Date(s) Administered  . Hepatitis A 12/08/2011   . PFIZER(Purple Top)SARS-COV-2 Vaccination 12/13/2019, 01/03/2020  . Tdap 12/08/2011, 07/05/2022    Health Maintenance  Topic Date Due  . COVID-19 Vaccine (3 - Pfizer series) 07/21/2022 (Originally 02/28/2020)  . Zoster Vaccines- Shingrix (1 of 2) 10/05/2022 (Originally 12/07/2011)  . HIV Screening  03/05/2023 (Originally 12/06/1976)  . MAMMOGRAM  07/06/2023 (Originally 01/27/2022)  . Fecal DNA (Cologuard)  07/06/2023 (Originally 12/07/2006)  . INFLUENZA VACCINE  05/10/2047 (Originally 04/05/2022)  . PAP SMEAR-Modifier  12/23/2022  . TETANUS/TDAP  07/05/2032  . Hepatitis C Screening  Completed  . HPV VACCINES  Aged Out    Discussed health benefits of physical activity, and encouraged her to engage in regular exercise appropriate for her age and condition.  Problem List Items Addressed This Visit       Unprioritized   Vitamin D insufficiency   Relevant Orders   Vitamin D (25 hydroxy)   Other Visit Diagnoses     Routine physical examination    -  Primary   Relevant Orders   TSH   Lipid Panel w/reflex Direct LDL   COMPLETE METABOLIC PANEL WITH GFR   CBC with Differential/Platelet   Vitamin D (25 hydroxy)   Screening for lipid disorders       Relevant Orders   Lipid Panel w/reflex Direct LDL   Elevated glucose  Relevant Orders   COMPLETE METABOLIC PANEL WITH GFR   Thyroid disorder screen       Relevant Orders   TSH   Diabetes mellitus screening       Relevant Orders   COMPLETE METABOLIC PANEL WITH GFR   Need for Tdap vaccination       Relevant Orders   Tdap vaccine greater than or equal to 7yo IM (Completed)      No follow-ups on file.     Teresa Planas, PA-C

## 2022-07-05 NOTE — Patient Instructions (Signed)

## 2022-07-06 ENCOUNTER — Encounter: Payer: Self-pay | Admitting: Physician Assistant

## 2022-07-11 ENCOUNTER — Other Ambulatory Visit: Payer: Self-pay | Admitting: Physician Assistant

## 2022-07-13 ENCOUNTER — Encounter: Payer: BC Managed Care – PPO | Admitting: Physician Assistant

## 2022-08-08 ENCOUNTER — Encounter: Payer: Self-pay | Admitting: Physician Assistant

## 2022-12-30 ENCOUNTER — Telehealth: Payer: Self-pay | Admitting: Physician Assistant

## 2022-12-30 NOTE — Telephone Encounter (Signed)
Pt called.  We have her scheduled for 6 mnth f/u but she does not know anything about it, do you  need to see her? (please respond to [facility] admin pool since I'm not here everyday).

## 2023-01-04 ENCOUNTER — Ambulatory Visit: Payer: BC Managed Care – PPO | Admitting: Physician Assistant

## 2023-07-07 ENCOUNTER — Telehealth: Payer: Self-pay | Admitting: Physician Assistant

## 2023-07-07 ENCOUNTER — Encounter: Payer: Self-pay | Admitting: Physician Assistant

## 2023-07-07 ENCOUNTER — Ambulatory Visit (INDEPENDENT_AMBULATORY_CARE_PROVIDER_SITE_OTHER): Payer: BC Managed Care – PPO | Admitting: Physician Assistant

## 2023-07-07 VITALS — BP 136/83 | HR 96 | Ht 64.0 in | Wt 164.0 lb

## 2023-07-07 DIAGNOSIS — M25572 Pain in left ankle and joints of left foot: Secondary | ICD-10-CM

## 2023-07-07 DIAGNOSIS — Z1211 Encounter for screening for malignant neoplasm of colon: Secondary | ICD-10-CM

## 2023-07-07 DIAGNOSIS — R5383 Other fatigue: Secondary | ICD-10-CM

## 2023-07-07 DIAGNOSIS — Z0000 Encounter for general adult medical examination without abnormal findings: Secondary | ICD-10-CM

## 2023-07-07 DIAGNOSIS — F900 Attention-deficit hyperactivity disorder, predominantly inattentive type: Secondary | ICD-10-CM

## 2023-07-07 DIAGNOSIS — M25571 Pain in right ankle and joints of right foot: Secondary | ICD-10-CM

## 2023-07-07 DIAGNOSIS — R4189 Other symptoms and signs involving cognitive functions and awareness: Secondary | ICD-10-CM

## 2023-07-07 DIAGNOSIS — E559 Vitamin D deficiency, unspecified: Secondary | ICD-10-CM

## 2023-07-07 DIAGNOSIS — F3341 Major depressive disorder, recurrent, in partial remission: Secondary | ICD-10-CM

## 2023-07-07 DIAGNOSIS — N951 Menopausal and female climacteric states: Secondary | ICD-10-CM | POA: Diagnosis not present

## 2023-07-07 DIAGNOSIS — H9312 Tinnitus, left ear: Secondary | ICD-10-CM

## 2023-07-07 DIAGNOSIS — H6993 Unspecified Eustachian tube disorder, bilateral: Secondary | ICD-10-CM

## 2023-07-07 MED ORDER — FLUTICASONE PROPIONATE 50 MCG/ACT NA SUSP: 2.0000 | Freq: Every day | NASAL | 0 refills | Status: AC

## 2023-07-07 MED ORDER — AMPHETAMINE-DEXTROAMPHETAMINE 10 MG PO TABS: 10.0000 mg | ORAL_TABLET | Freq: Two times a day (BID) | ORAL | 0 refills | Status: DC

## 2023-07-07 MED ORDER — CLIMARA PRO 0.045-0.015 MG/DAY TD PTWK: 1.0000 | MEDICATED_PATCH | TRANSDERMAL | 2 refills | Status: DC

## 2023-07-07 MED ORDER — VILAZODONE HCL 10 MG PO TABS: 10.0000 mg | ORAL_TABLET | Freq: Every day | ORAL | 3 refills | Status: DC

## 2023-07-07 NOTE — Telephone Encounter (Signed)
Patient called back she is requesting hormone replacement be sent to pharmacy  Pharmacy [facility] on [address] [place] [address]  Phone number [phone]

## 2023-07-07 NOTE — Patient Instructions (Addendum)
Start flonase and claritin d daily  Restart adderall and viibryd  Start tumeric 500mg  twice a day for ankle pain  Get labs Return cologuard  Health Maintenance, Female Adopting a healthy lifestyle and getting preventive care are important in promoting health and wellness. Ask your health care provider about: The right schedule for you to have regular tests and exams. Things you can do on your own to prevent diseases and keep yourself healthy. What should I know about diet, weight, and exercise? Eat a healthy diet  Eat a diet that includes plenty of vegetables, fruits, low-fat dairy products, and lean protein. Do not eat a lot of foods that are high in solid fats, added sugars, or sodium. Maintain a healthy weight Body mass index (BMI) is used to identify weight problems. It estimates body fat based on height and weight. Your health care provider can help determine your BMI and help you achieve or maintain a healthy weight. Get regular exercise Get regular exercise. This is one of the most important things you can do for your health. Most adults should: Exercise for at least 150 minutes each week. The exercise should increase your heart rate and make you sweat (moderate-intensity exercise). Do strengthening exercises at least twice a week. This is in addition to the moderate-intensity exercise. Spend less time sitting. Even light physical activity can be beneficial. Watch cholesterol and blood lipids Have your blood tested for lipids and cholesterol at 61 years of age, then have this test every 5 years. Have your cholesterol levels checked more often if: Your lipid or cholesterol levels are high. You are older than 61 years of age. You are at high risk for heart disease. What should I know about cancer screening? Depending on your health history and family history, you may need to have cancer screening at various ages. This may include screening for: Breast cancer. Cervical  cancer. Colorectal cancer. Skin cancer. Lung cancer. What should I know about heart disease, diabetes, and high blood pressure? Blood pressure and heart disease High blood pressure causes heart disease and increases the risk of stroke. This is more likely to develop in people who have high blood pressure readings or are overweight. Have your blood pressure checked: Every 3-5 years if you are 72-66 years of age. Every year if you are 67 years old or older. Diabetes Have regular diabetes screenings. This checks your fasting blood sugar level. Have the screening done: Once every three years after age 54 if you are at a normal weight and have a low risk for diabetes. More often and at a younger age if you are overweight or have a high risk for diabetes. What should I know about preventing infection? Hepatitis B If you have a higher risk for hepatitis B, you should be screened for this virus. Talk with your health care provider to find out if you are at risk for hepatitis B infection. Hepatitis C Testing is recommended for: Everyone born from [birth year] through 1964. Anyone with known risk factors for hepatitis C. Sexually transmitted infections (STIs) Get screened for STIs, including gonorrhea and chlamydia, if: You are sexually active and are younger than 61 years of age. You are older than 61 years of age and your health care provider tells you that you are at risk for this type of infection. Your sexual activity has changed since you were last screened, and you are at increased risk for chlamydia or gonorrhea. Ask your health care provider if you are at risk.  Ask your health care provider about whether you are at high risk for HIV. Your health care provider may recommend a prescription medicine to help prevent HIV infection. If you choose to take medicine to prevent HIV, you should first get tested for HIV. You should then be tested every 3 months for as long as you are taking the  medicine. Pregnancy If you are about to stop having your period (premenopausal) and you may become pregnant, seek counseling before you get pregnant. Take 400 to 800 micrograms (mcg) of folic acid every day if you become pregnant. Ask for birth control (contraception) if you want to prevent pregnancy. Osteoporosis and menopause Osteoporosis is a disease in which the bones lose minerals and strength with aging. This can result in bone fractures. If you are 28 years old or older, or if you are at risk for osteoporosis and fractures, ask your health care provider if you should: Be screened for bone loss. Take a calcium or vitamin D supplement to lower your risk of fractures. Be given hormone replacement therapy (HRT) to treat symptoms of menopause. Follow these instructions at home: Alcohol use Do not drink alcohol if: Your health care provider tells you not to drink. You are pregnant, may be pregnant, or are planning to become pregnant. If you drink alcohol: Limit how much you have to: 0-1 drink a day. Know how much alcohol is in your drink. In the [place] one drink equals one 12 oz bottle of beer (355 mL), one 5 oz glass of wine (148 mL), or one 1 oz glass of hard liquor (44 mL). Lifestyle Do not use any products that contain nicotine or tobacco. These products include cigarettes, chewing tobacco, and vaping devices, such as e-cigarettes. If you need help quitting, ask your health care provider. Do not use street drugs. Do not share needles. Ask your health care provider for help if you need support or information about quitting drugs. General instructions Schedule regular health, dental, and eye exams. Stay current with your vaccines. Tell your health care provider if: You often feel depressed. You have ever been abused or do not feel safe at home. Summary Adopting a healthy lifestyle and getting preventive care are important in promoting health and wellness. Follow your health care  provider's instructions about healthy diet, exercising, and getting tested or screened for diseases. Follow your health care provider's instructions on monitoring your cholesterol and blood pressure. This information is not intended to replace advice given to you by your health care provider. Make sure you discuss any questions you have with your health care provider. Document Revised: 01/11/2021 Document Reviewed: 01/11/2021 Elsevier Patient Education  2024 [employer].

## 2023-07-07 NOTE — Progress Notes (Unsigned)
   Complete physical exam  Patient: Teresa Taylor   DOB: 02-09-1961   62 y.o. Female  MRN: [id number]  Subjective:    No chief complaint on file.   Teresa Taylor is a 62 y.o. female who presents today for a complete physical exam. She reports consuming a {diet types:17450} diet. {types:19826} She generally feels {DESC; WELL/FAIRLY WELL/POORLY:18703}. She reports sleeping {DESC; WELL/FAIRLY WELL/POORLY:18703}. She {does/does not:200015} have additional problems to discuss today.    Most recent fall risk assessment:    07/05/2022    2:20 PM  Fall Risk   Falls in the past year? 0  Number falls in past yr: 0  Injury with Fall? 0  Risk for fall due to : No Fall Risks  Follow up Falls evaluation completed     Most recent depression screenings:    07/05/2022    2:26 PM 03/04/2022   10:52 AM  PHQ 2/9 Scores  PHQ - 2 Score 4 2  PHQ- 9 Score 13     {VISON DENTAL STD PSA (Optional):27386}  {History (Optional):23778}  Patient Care Team: Teresa Taylor as PCP - General (Family Medicine)   Outpatient Medications Prior to Visit  Medication Sig   amphetamine-dextroamphetamine (ADDERALL) 10 MG tablet Take 1 tablet (10 mg total) by mouth 2 (two) times daily.   amphetamine-dextroamphetamine (ADDERALL) 10 MG tablet Take 1 tablet (10 mg total) by mouth 2 (two) times daily.   amphetamine-dextroamphetamine (ADDERALL) 10 MG tablet Take 1 tablet (10 mg total) by mouth 2 (two) times daily.   Vilazodone HCl (VIIBRYD) 10 MG TABS Take 1 tablet (10 mg total) by mouth daily.   No facility-administered medications prior to visit.    ROS        Objective:     There were no vitals taken for this visit. {Vitals History (Optional):23777}  Physical Exam   No results found for any visits on 07/07/23. {Show previous labs (optional):23779}    Assessment & Plan:    Routine Health Maintenance and Physical Exam  Immunization History  Administered Date(s) Administered   Hepatitis A  12/08/2011   PFIZER(Purple Top)SARS-COV-2 Vaccination 12/13/2019, 01/03/2020   Tdap 12/08/2011, 07/05/2022    Health Maintenance  Topic Date Due   HIV Screening  Never done   Fecal DNA (Cologuard)  Never done   Zoster Vaccines- Shingrix (1 of 2) Never done   Cervical Cancer Screening (HPV/Pap Cotest)  12/23/2022   MAMMOGRAM  03/13/2023   COVID-19 Vaccine (3 - 2023-24 season) 05/07/2023   INFLUENZA VACCINE  05/10/2047 (Originally 04/06/2023)   DTaP/Tdap/Td (3 - Td or Tdap) 07/05/2032   Hepatitis C Screening  Completed   HPV VACCINES  Aged Out    Discussed health benefits of physical activity, and encouraged her to engage in regular exercise appropriate for her age and condition.  Problem List Items Addressed This Visit       Unprioritized   Vitamin D insufficiency - Primary   Other Visit Diagnoses     Routine physical examination          No follow-ups on file.     Tandy Gaw, PA-C

## 2023-07-07 NOTE — Telephone Encounter (Signed)
Ok patch sent to pharmacy to wear weekly.

## 2023-07-07 NOTE — Telephone Encounter (Signed)
I thought we had discussed starting the adderall and vibryd back first and then starting HRT?

## 2023-07-10 DIAGNOSIS — N951 Menopausal and female climacteric states: Secondary | ICD-10-CM | POA: Diagnosis not present

## 2023-07-10 DIAGNOSIS — R4189 Other symptoms and signs involving cognitive functions and awareness: Secondary | ICD-10-CM | POA: Diagnosis not present

## 2023-07-10 DIAGNOSIS — M25572 Pain in left ankle and joints of left foot: Secondary | ICD-10-CM | POA: Insufficient documentation

## 2023-07-10 DIAGNOSIS — R5383 Other fatigue: Secondary | ICD-10-CM | POA: Insufficient documentation

## 2023-07-10 DIAGNOSIS — E559 Vitamin D deficiency, unspecified: Secondary | ICD-10-CM | POA: Diagnosis not present

## 2023-07-10 DIAGNOSIS — M25571 Pain in right ankle and joints of right foot: Secondary | ICD-10-CM | POA: Insufficient documentation

## 2023-07-10 DIAGNOSIS — Z0000 Encounter for general adult medical examination without abnormal findings: Secondary | ICD-10-CM | POA: Diagnosis not present

## 2023-07-11 LAB — CBC WITH DIFFERENTIAL/PLATELET
Basophils Absolute: 0.1 10*3/uL (ref 0.0–0.2)
Basos: 1 %
EOS (ABSOLUTE): 0.6 10*3/uL — ABNORMAL HIGH (ref 0.0–0.4)
Eos: 8 %
Hematocrit: 36.8 % (ref 34.0–46.6)
Hemoglobin: 11.1 g/dL (ref 11.1–15.9)
Immature Grans (Abs): 0 10*3/uL (ref 0.0–0.1)
Immature Granulocytes: 0 %
Lymphocytes Absolute: 2.5 10*3/uL (ref 0.7–3.1)
Lymphs: 34 %
MCH: 20.6 pg — ABNORMAL LOW (ref 26.6–33.0)
MCHC: 30.2 g/dL — ABNORMAL LOW (ref 31.5–35.7)
MCV: 68 fL — ABNORMAL LOW (ref 79–97)
Monocytes Absolute: 0.5 10*3/uL (ref 0.1–0.9)
Monocytes: 7 %
Neutrophils Absolute: 3.6 10*3/uL (ref 1.4–7.0)
Neutrophils: 50 %
Platelets: 249 10*3/uL (ref 150–450)
RBC: 5.4 x10E6/uL — ABNORMAL HIGH (ref 3.77–5.28)
RDW: 15.6 % — ABNORMAL HIGH (ref 11.7–15.4)
WBC: 7.4 10*3/uL (ref 3.4–10.8)

## 2023-07-11 LAB — CMP14+EGFR
ALT: 13 [IU]/L (ref 0–32)
AST: 20 [IU]/L (ref 0–40)
Albumin: 4.5 g/dL (ref 3.9–4.9)
Alkaline Phosphatase: 110 [IU]/L (ref 44–121)
BUN/Creatinine Ratio: 16 (ref 12–28)
BUN: 14 mg/dL (ref 8–27)
Bilirubin Total: 0.6 mg/dL (ref 0.0–1.2)
CO2: 23 mmol/L (ref 20–29)
Calcium: 9.4 mg/dL (ref 8.7–10.3)
Chloride: 105 mmol/L (ref 96–106)
Creatinine, Ser: 0.87 mg/dL (ref 0.57–1.00)
Globulin, Total: 2.6 g/dL (ref 1.5–4.5)
Glucose: 91 mg/dL (ref 70–99)
Potassium: 4.2 mmol/L (ref 3.5–5.2)
Sodium: 142 mmol/L (ref 134–144)
Total Protein: 7.1 g/dL (ref 6.0–8.5)
eGFR: 76 mL/min/{1.73_m2} (ref >59)

## 2023-07-11 LAB — VITAMIN D 25 HYDROXY (VIT D DEFICIENCY, FRACTURES): Vit D, 25-Hydroxy: 25.9 ng/mL — ABNORMAL LOW (ref 30.0–100.0)

## 2023-07-11 LAB — B12 AND FOLATE PANEL
Folate: 13.1 ng/mL (ref >3.0)
Vitamin B-12: 346 pg/mL (ref 232–1245)

## 2023-07-11 LAB — LIPID PANEL
Chol/HDL Ratio: 3.9 ratio (ref 0.0–4.4)
Cholesterol, Total: 208 mg/dL — ABNORMAL HIGH (ref 100–199)
HDL: 53 mg/dL (ref >39)
LDL Chol Calc (NIH): 135 mg/dL — ABNORMAL HIGH (ref 0–99)
Triglycerides: 109 mg/dL (ref 0–149)
VLDL Cholesterol Cal: 20 mg/dL (ref 5–40)

## 2023-07-11 LAB — TSH: TSH: 2.79 u[IU]/mL (ref 0.450–4.500)

## 2023-07-11 NOTE — Progress Notes (Signed)
Promiss,   Vitamin D low. Take at least 2000 units a day and with dairy/eggs for better absorption.   Kidney, liver, glucose look good!   Thyroid looks great.   B12 is low normal. I would take of b12 daily.   Hemoglobin low and red blood cells small indicating iron deficiency. Are you taking any iron supplements? Are you eating iron rich foods? I would take iron supplement in the morning once a day 60mg  OTC.   HDL, good cholesterol, is good.  LDL, bad cholesterol, is not quite to goal but good!   10 year risk of cardiovascular event is 4.3 and under 7.5 percent for medication. Continue to work on low fat/processed/fried food diet and get 150 minutes of exercise a week. Recheck in one year.   Marland Kitchen.The 10-year ASCVD risk score (Arnett DK, et al., 2019) is: 4.3%   Values used to calculate the score:     Age: 62 years     Sex: Female     Is Non-Hispanic African American: No     Diabetic: No     Tobacco smoker: No     Systolic Blood Pressure: 136 mmHg     Is BP treated: No     HDL Cholesterol: 53 mg/dL     Total Cholesterol: 208 mg/dL

## 2023-08-10 ENCOUNTER — Other Ambulatory Visit: Payer: Self-pay | Admitting: Physician Assistant

## 2023-08-10 DIAGNOSIS — F900 Attention-deficit hyperactivity disorder, predominantly inattentive type: Secondary | ICD-10-CM

## 2023-10-07 DIAGNOSIS — Z1211 Encounter for screening for malignant neoplasm of colon: Secondary | ICD-10-CM | POA: Diagnosis not present

## 2023-10-09 ENCOUNTER — Encounter: Payer: Self-pay | Admitting: Physician Assistant

## 2023-10-09 ENCOUNTER — Ambulatory Visit (INDEPENDENT_AMBULATORY_CARE_PROVIDER_SITE_OTHER): Payer: BC Managed Care – PPO | Admitting: Physician Assistant

## 2023-10-09 ENCOUNTER — Other Ambulatory Visit: Payer: Self-pay | Admitting: Physician Assistant

## 2023-10-09 VITALS — BP 124/78 | HR 80 | Ht 64.0 in | Wt 161.0 lb

## 2023-10-09 DIAGNOSIS — H9312 Tinnitus, left ear: Secondary | ICD-10-CM | POA: Diagnosis not present

## 2023-10-09 DIAGNOSIS — F900 Attention-deficit hyperactivity disorder, predominantly inattentive type: Secondary | ICD-10-CM | POA: Diagnosis not present

## 2023-10-09 DIAGNOSIS — H6993 Unspecified Eustachian tube disorder, bilateral: Secondary | ICD-10-CM

## 2023-10-09 DIAGNOSIS — R4189 Other symptoms and signs involving cognitive functions and awareness: Secondary | ICD-10-CM

## 2023-10-09 DIAGNOSIS — F3341 Major depressive disorder, recurrent, in partial remission: Secondary | ICD-10-CM

## 2023-10-09 MED ORDER — ESTRADIOL-NORETHINDRONE ACET 1-0.5 MG PO TABS: 1.0000 | ORAL_TABLET | Freq: Every day | ORAL | 12 refills | Status: DC

## 2023-10-09 MED ORDER — AMPHETAMINE-DEXTROAMPHETAMINE 10 MG PO TABS: 10.0000 mg | ORAL_TABLET | Freq: Two times a day (BID) | ORAL | 0 refills | Status: DC
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
## 2023-10-09 NOTE — Telephone Encounter (Signed)
Copied from CRM [id number]. Topic: Clinical - Medication Refill >> Oct 09, 2023  3:11 PM Elle L wrote: Most Recent Primary Care Visit:  Provider: Jomarie Longs  Department: [facility]  Visit Type: OFFICE VISIT  Date: 10/09/2023  Medication: Vilazodone HCl (VIIBRYD) 10 MG TABS  Has the patient contacted their pharmacy? Yes (Agent: If no, request that the patient contact the pharmacy for the refill. If patient does not wish to contact the pharmacy document the reason why and proceed with request.) (Agent: If yes, when and what did the pharmacy advise?)  Is this the correct pharmacy for this prescription? Yes  This is the patient's preferred pharmacy:  [id number] [place] [place] - [address] [place] [address] Phone: [phone] Fax: [phone]   Has the prescription been filled recently? No  Is the patient out of the medication? No  Has the patient been seen for an appointment in the last year OR does the patient have an upcoming appointment? Yes  Can we respond through MyChart? Yes  Agent: Please be advised that Rx refills may take up to 3 business days. We ask that you follow-up with your pharmacy.

## 2023-10-10 MED ORDER — VILAZODONE HCL 10 MG PO TABS: 10.0000 mg | ORAL_TABLET | Freq: Every day | ORAL | 3 refills | Status: DC

## 2023-10-11 ENCOUNTER — Encounter: Payer: Self-pay | Admitting: Physician Assistant

## 2023-10-16 ENCOUNTER — Encounter: Payer: Self-pay | Admitting: Physician Assistant

## 2023-10-16 LAB — COLOGUARD: COLOGUARD: NEGATIVE

## 2024-04-08 ENCOUNTER — Ambulatory Visit (INDEPENDENT_AMBULATORY_CARE_PROVIDER_SITE_OTHER): Payer: BC Managed Care – PPO | Admitting: Physician Assistant

## 2024-04-08 ENCOUNTER — Encounter: Payer: Self-pay | Admitting: Physician Assistant

## 2024-04-08 VITALS — BP 130/80 | HR 77 | Ht 64.0 in | Wt 160.5 lb

## 2024-04-08 DIAGNOSIS — N951 Menopausal and female climacteric states: Secondary | ICD-10-CM

## 2024-04-08 DIAGNOSIS — F909 Attention-deficit hyperactivity disorder, unspecified type: Secondary | ICD-10-CM

## 2024-04-08 MED ORDER — AMPHETAMINE-DEXTROAMPHETAMINE 15 MG PO TABS: 15.0000 mg | ORAL_TABLET | Freq: Two times a day (BID) | ORAL | 0 refills | Status: DC

## 2024-04-08 NOTE — Progress Notes (Signed)
   Established Patient Office Visit  Subjective   Patient ID: Teresa Taylor, female    DOB: May 26, 1961  Age: 63 y.o. MRN: [id number]  Chief Complaint  Patient presents with   ADD    HPI Pt is a 63 yo female who presents to the clinic for medication management and follow up on ADHD and menopausal symptoms.   She is doing really well. Starting HRT has improved brain fog, hot flashes, joint pain, hair loss, sleep, skin changes. She feels so much better. She is doing well with mood but focus is still struggling a bit. She has started a new job and going back to school to get her masters.    Review of Systems  All other systems reviewed and are negative.     Objective:     BP 130/80   Pulse 77   Ht 5' 4 (1.626 m)   Wt 160 lb 8 oz (72.8 kg)   SpO2 97%   BMI 27.55 kg/m  BP Readings from Last 3 Encounters:  04/08/24 130/80  10/09/23 124/78  07/07/23 136/83   Wt Readings from Last 3 Encounters:  04/08/24 160 lb 8 oz (72.8 kg)  10/09/23 161 lb (73 kg)  07/07/23 164 lb (74.4 kg)      Physical Exam Constitutional:      Appearance: Normal appearance.  HENT:     Head: Normocephalic.  Cardiovascular:     Rate and Rhythm: Normal rate and regular rhythm.  Pulmonary:     Effort: Pulmonary effort is normal.     Breath sounds: Normal breath sounds.  Neurological:     General: No focal deficit present.     Mental Status: She is alert and oriented to person, place, and time.  Psychiatric:        Mood and Affect: Mood normal.      The 10-year ASCVD risk score (Arnett DK, et al., 2019) is: 4.4%    Assessment & Plan:  Teresa Taylor was seen today for add.  Diagnoses and all orders for this visit:  Attention deficit hyperactivity disorder (ADHD), unspecified ADHD type -     amphetamine -dextroamphetamine  (ADDERALL) 15 MG tablet; Take 1 tablet by mouth 2 (two) times daily. -     amphetamine -dextroamphetamine  (ADDERALL) 15 MG tablet; Take 1 tablet by mouth 2 (two) times daily. -      amphetamine -dextroamphetamine  (ADDERALL) 15 MG tablet; Take 1 tablet by mouth 2 (two) times daily.  Menopausal symptoms   Continue same medications Increased adderall to 15mg  twice a day Needs mammogram and will schedule when new insurance kicks in BP recheck better and patient reports 120s over 80 BP at home Labs UTD   Return in about 6 months (around 10/09/2024).    Aidynn Krenn, PA-C

## 2024-06-17 ENCOUNTER — Encounter: Payer: Self-pay | Admitting: Physician Assistant

## 2024-06-17 ENCOUNTER — Ambulatory Visit: Admitting: Physician Assistant

## 2024-06-17 VITALS — BP 162/89 | HR 89 | Ht 64.0 in | Wt 158.0 lb

## 2024-06-17 DIAGNOSIS — J3489 Other specified disorders of nose and nasal sinuses: Secondary | ICD-10-CM | POA: Diagnosis not present

## 2024-06-17 DIAGNOSIS — L089 Local infection of the skin and subcutaneous tissue, unspecified: Secondary | ICD-10-CM | POA: Insufficient documentation

## 2024-06-17 MED ORDER — DOXYCYCLINE HYCLATE 100 MG PO TABS: 100.0000 mg | ORAL_TABLET | Freq: Two times a day (BID) | ORAL | 0 refills | Status: DC

## 2024-06-17 NOTE — Progress Notes (Signed)
 "  Acute Office Visit  Subjective:     Patient ID: Teresa Taylor, female    DOB: Jan 18, 1961, 63 y.o.   MRN: [id number]  Chief Complaint  Patient presents with   Medical Management of Chronic Issues    HPI Discussed the use of AI scribe software for clinical note transcription with the patient, who gave verbal consent to proceed.  History of Present Illness Teresa Taylor is a 63 year old female who presents with nasal inflammation and pustules following a recent COVID-19 infection.   Nasal inflammation and pustules - Nasal inflammation began four weeks ago following COVID-19 infection. - Frequent nose blowing during illness. - Redness and soreness localized to the tip of the nose, more pronounced on one side. - Persistent tenderness when pressing on the tip of the nose. - Onset of pustules on the nose last night, raising concern for infection. - Concern about cosmetic appearance due to central facial location. - No prior history of acne. - Regular use of an organic liquid facial cleanser.  Upper respiratory symptoms - Residual sinus pressure and rhinorrhea, attributed to recent allergen exposure at a fair last week. - Itching experienced at the fair, possibly due to allergen exposure. - Improvement in symptoms with Benadryl. - No sore throat or otalgia. - Sensation of fluid in the ears.    ROS See HPI.      Objective:    BP (!) 162/89   Pulse 89   Ht 5' 4 (1.626 m)   Wt 158 lb (71.7 kg)   SpO2 99%   BMI 27.12 kg/m  BP Readings from Last 3 Encounters:  06/17/24 (!) 162/89  04/08/24 130/80  10/09/23 124/78   Wt Readings from Last 3 Encounters:  06/17/24 158 lb (71.7 kg)  04/08/24 160 lb 8 oz (72.8 kg)  10/09/23 161 lb (73 kg)      Physical Exam Constitutional:      Appearance: Normal appearance.  HENT:     Head: Normocephalic.     Right Ear: Tympanic membrane, ear canal and external ear normal. There is no impacted cerumen.     Left Ear: Tympanic  membrane, ear canal and external ear normal. There is no impacted cerumen.     Nose: No congestion or rhinorrhea.     Comments: Tip of nose erythematous and tender with scattered pustules over the tip. Inside nostril of right erythematous with some appearance of erythematous sore just inside the nostril.     Mouth/Throat:     Mouth: Mucous membranes are moist.     Pharynx: No posterior oropharyngeal erythema.  Eyes:     Pupils: Pupils are equal, round, and reactive to light.  Cardiovascular:     Rate and Rhythm: Normal rate.  Pulmonary:     Effort: Pulmonary effort is normal.  Neurological:     General: No focal deficit present.     Mental Status: She is oriented to person, place, and time.  Psychiatric:        Mood and Affect: Mood normal.          Assessment & Plan:  SABRASABRATammy was seen today for medical management of chronic issues.  Diagnoses and all orders for this visit:  Pustule of nostril -     doxycycline  (VIBRA -TABS) 100 MG tablet; Take 1 tablet (100 mg total) by mouth 2 (two) times daily.  Nasal vestibulitis -     doxycycline  (VIBRA -TABS) 100 MG tablet; Take 1 tablet (100 mg total) by mouth 2 (two)  times daily.   Assessment & Plan Nasal vestibulitis with pustule Nasal vestibulitis with pustule likely due to staphylococcal infection. Differential diagnosis includes acne-related pustules, but bacterial infection is suspected. - Prescribe doxycycline  for 7 days. - Advise warm compresses. - keep face clean by washing daily    Surafel Hilleary, PA-C   "

## 2024-06-18 ENCOUNTER — Encounter: Payer: Self-pay | Admitting: Physician Assistant

## 2024-08-05 ENCOUNTER — Telehealth: Payer: Self-pay | Admitting: Physician Assistant

## 2024-08-05 DIAGNOSIS — F909 Attention-deficit hyperactivity disorder, unspecified type: Secondary | ICD-10-CM

## 2024-08-05 NOTE — Telephone Encounter (Unsigned)
 Copied from CRM #[id number]. Topic: Clinical - Medication Refill >> Aug 05, 2024  9:49 AM Amy B wrote: Medication: amphetamine -dextroamphetamine  (ADDERALL) 15 MG tablet  Has the patient contacted their pharmacy? No (Agent: If no, request that the patient contact the pharmacy for the refill. If patient does not wish to contact the pharmacy document the reason why and proceed with request.) (Agent: If yes, when and what did the pharmacy advise?)  This is the patient's preferred pharmacy:  [id number] [place], [place] - [address]  [place] [address] Phone: [phone] Fax: [phone]  Is this the correct pharmacy for this prescription? Yes If no, delete pharmacy and type the correct one.   Has the prescription been filled recently? No  Is the patient out of the medication? Yes  Has the patient been seen for an appointment in the last year OR does the patient have an upcoming appointment? Yes  Can we respond through MyChart? Yes  Agent: Please be advised that Rx refills may take up to 3 business days. We ask that you follow-up with your pharmacy.

## 2024-08-06 MED ORDER — AMPHETAMINE-DEXTROAMPHETAMINE 15 MG PO TABS: 15.0000 mg | ORAL_TABLET | Freq: Two times a day (BID) | ORAL | 0 refills | Status: AC

## 2024-08-06 MED ORDER — AMPHETAMINE-DEXTROAMPHETAMINE 15 MG PO TABS: 15.0000 mg | ORAL_TABLET | Freq: Two times a day (BID) | ORAL | 0 refills | Status: DC

## 2024-09-17 ENCOUNTER — Encounter: Payer: Self-pay | Admitting: Physician Assistant

## 2024-09-17 ENCOUNTER — Ambulatory Visit: Admitting: Physician Assistant

## 2024-09-17 VITALS — BP 125/82 | HR 65

## 2024-09-17 DIAGNOSIS — F3341 Major depressive disorder, recurrent, in partial remission: Secondary | ICD-10-CM | POA: Diagnosis not present

## 2024-09-17 DIAGNOSIS — F909 Attention-deficit hyperactivity disorder, unspecified type: Secondary | ICD-10-CM | POA: Diagnosis not present

## 2024-09-17 DIAGNOSIS — R4189 Other symptoms and signs involving cognitive functions and awareness: Secondary | ICD-10-CM

## 2024-09-17 DIAGNOSIS — N951 Menopausal and female climacteric states: Secondary | ICD-10-CM

## 2024-09-17 MED ORDER — VILAZODONE HCL 10 MG PO TABS: 10.0000 mg | ORAL_TABLET | Freq: Every day | ORAL | 4 refills | Status: AC

## 2024-09-17 MED ORDER — ESTRADIOL-NORETHINDRONE ACET 1-0.5 MG PO TABS: 1.0000 | ORAL_TABLET | Freq: Every day | ORAL | 4 refills | Status: AC

## 2024-09-17 MED ORDER — AMPHETAMINE-DEXTROAMPHETAMINE 15 MG PO TABS: 15.0000 mg | ORAL_TABLET | Freq: Two times a day (BID) | ORAL | 0 refills | Status: AC

## 2024-09-17 NOTE — Progress Notes (Signed)
 "  Established Patient Office Visit  Subjective   Patient ID: Teresa Taylor, female    DOB: 04/08/1961  Age: 64 y.o. MRN: [id number]  Chief Complaint  Patient presents with   Medical Management of Chronic Issues    HPI Discussed the use of AI scribe software for clinical note transcription with the patient, who gave verbal consent to proceed.  History of Present Illness Teresa Taylor is a 64 year old female who presents for medication management and follow-up.  Medication effects and adherence - Currently taking Adderall and Activella with good efficacy. - Missed doses of Activella result in ankle pain, generalized aches, and dry eye, which resolve upon resuming the medication.  Sleep disturbance - Difficulty initiating and maintaining sleep, attributed to work-related stress. - Use of the 'shut eye app' for sleep support. - Previously used a supplement from [employer] with good effect, but currently unavailable. - Melatonin facilitates sleep onset but leads to early awakening with inability to return to sleep.  Blood pressure fluctuations - Home blood pressure readings are usually low, but recent measurements have been elevated. - Regular home blood pressure monitoring. - No chest pain or palpitations.  MDD - doing great on viibryd  - no concerns     ROS See HPI.    Objective:     BP 125/82 (Cuff Size: Normal)   Pulse 65   .    09/17/2024    3:52 PM 04/08/2024    9:07 AM 04/08/2024    9:06 AM 10/09/2023    9:40 AM 07/07/2023   10:02 AM  Depression screen PHQ 2/9  Decreased Interest 0 0 0 1 1  Down, Depressed, Hopeless 0 0 0 0 1  PHQ - 2 Score 0 0 0 1 2  Altered sleeping 2 0  2 3  Tired, decreased energy 2 0  2 3  Change in appetite 0 0  0 0  Feeling bad or failure about yourself  0 0  0 1  Trouble concentrating 1 0  2 3  Moving slowly or fidgety/restless 0 0  0 0  Suicidal thoughts 0 0  0 0  PHQ-9 Score 5 0   7  12   Difficult doing work/chores Somewhat  difficult Not difficult at all   Somewhat difficult     Data saved with a previous flowsheet row definition   .    09/17/2024    3:52 PM 10/09/2023    9:41 AM 07/07/2023   10:03 AM 07/05/2022    2:27 PM  GAD 7 : Generalized Anxiety Score  Nervous, Anxious, on Edge 0 0 0 2  Control/stop worrying 1 0 0 1  Worry too much - different things 1 0 0 1  Trouble relaxing 1 0 1 1  Restless 0 0 0 0  Easily annoyed or irritable 1 0 1 1  Afraid - awful might happen 0 0 0 0  Total GAD 7 Score 4 0 2 6  Anxiety Difficulty Somewhat difficult Not difficult at all Not difficult at all Not difficult at all      Physical Exam Constitutional:      Appearance: Normal appearance.  HENT:     Head: Normocephalic.  Cardiovascular:     Rate and Rhythm: Normal rate and regular rhythm.  Pulmonary:     Effort: Pulmonary effort is normal.     Breath sounds: Normal breath sounds.  Neurological:     General: No focal deficit present.     Mental Status:  She is alert and oriented to person, place, and time.  Psychiatric:        Mood and Affect: Mood normal.      The 10-year ASCVD risk score (Arnett DK, et al., 2019) is: 4.1%    Assessment & Plan:  SABRASABRATammy was seen today for medical management of chronic issues.  Diagnoses and all orders for this visit:  Attention deficit hyperactivity disorder (ADHD), unspecified ADHD type -     amphetamine -dextroamphetamine  (ADDERALL) 15 MG tablet; Take 1 tablet by mouth 2 (two) times daily. -     amphetamine -dextroamphetamine  (ADDERALL) 15 MG tablet; Take 1 tablet by mouth 2 (two) times daily. -     amphetamine -dextroamphetamine  (ADDERALL) 15 MG tablet; Take 1 tablet by mouth 2 (two) times daily.  Brain fog -     Vilazodone  HCl (VIIBRYD ) 10 MG TABS; Take 1 tablet (10 mg total) by mouth daily. -     estradiol -norethindrone  (ACTIVELLA) 1-0.5 MG tablet; Take 1 tablet by mouth daily.  Recurrent major depressive disorder, in partial remission -     Vilazodone  HCl  (VIIBRYD ) 10 MG TABS; Take 1 tablet (10 mg total) by mouth daily.  Menopausal symptoms -     estradiol -norethindrone  (ACTIVELLA) 1-0.5 MG tablet; Take 1 tablet by mouth daily.    Assessment & Plan Attention-deficit hyperactivity disorder Well-managed with Adderall. She reported satisfaction with the current regimen. - Continue Adderall as prescribed. - Continue to work on better sleep routine - Follow up in 6 months  MDD PHQ to goal - refilled Viibryd   Menopausal symptoms - controlled on activella, refilled today  Elevated blood pressure Blood pressure elevated at 156/52. She reported regular home monitoring without palpitations or chest pain. - Rechecked blood pressure in the office and improved significantly.  - Continue regular home blood pressure monitoring.   Return in about 6 months (around 03/17/2025).    Zebulan Hinshaw, PA-C  "

## 2025-03-17 ENCOUNTER — Encounter: Admitting: Physician Assistant
